# Patient Record
Sex: Female | Born: 1935 | Race: White | Hispanic: No | Smoking: Former smoker
Health system: Southern US, Community
[De-identification: ages and names within clinical notes are randomized; demographics above are authoritative.]

## PROBLEM LIST (undated history)

## (undated) DIAGNOSIS — E039 Hypothyroidism, unspecified: Secondary | ICD-10-CM

## (undated) DIAGNOSIS — I219 Acute myocardial infarction, unspecified: Secondary | ICD-10-CM

## (undated) DIAGNOSIS — I10 Essential (primary) hypertension: Secondary | ICD-10-CM

## (undated) DIAGNOSIS — M1990 Unspecified osteoarthritis, unspecified site: Secondary | ICD-10-CM

## (undated) DIAGNOSIS — I2510 Atherosclerotic heart disease of native coronary artery without angina pectoris: Secondary | ICD-10-CM

## (undated) DIAGNOSIS — D619 Aplastic anemia, unspecified: Secondary | ICD-10-CM

## (undated) DIAGNOSIS — E785 Hyperlipidemia, unspecified: Secondary | ICD-10-CM

## (undated) HISTORY — DX: Essential (primary) hypertension: I10

## (undated) HISTORY — DX: Unspecified osteoarthritis, unspecified site: M19.90

## (undated) HISTORY — DX: Hyperlipidemia, unspecified: E78.5

## (undated) HISTORY — DX: Atherosclerotic heart disease of native coronary artery without angina pectoris: I25.10

## (undated) HISTORY — DX: Acute myocardial infarction, unspecified: I21.9

## (undated) HISTORY — PX: CORONARY ANGIOPLASTY: SHX604

## (undated) HISTORY — DX: Aplastic anemia, unspecified: D61.9

## (undated) HISTORY — PX: HAND SURGERY: SHX662

## (undated) HISTORY — PX: APPENDECTOMY: SHX54

## (undated) HISTORY — PX: SHOULDER SURGERY: SHX246

## (undated) HISTORY — PX: THYROIDECTOMY, PARTIAL: SHX18

## (undated) HISTORY — PX: JOINT REPLACEMENT: SHX530

---

## 1983-03-09 HISTORY — PX: THORACIC SPINE SURGERY: SHX802

## 1998-03-08 HISTORY — PX: COLON SURGERY: SHX602

## 2000-03-08 HISTORY — PX: KNEE SURGERY: SHX244

## 2003-03-09 HISTORY — PX: CHOLECYSTECTOMY: SHX55

## 2008-03-08 HISTORY — PX: TOTAL KNEE ARTHROPLASTY: SHX125

## 2008-10-24 ENCOUNTER — Ambulatory Visit: Payer: Self-pay | Admitting: Cardiovascular Disease

## 2008-12-31 ENCOUNTER — Inpatient Hospital Stay (HOSPITAL_COMMUNITY): Admission: RE | Admit: 2008-12-31 | Discharge: 2009-01-04 | Payer: Self-pay | Admitting: Orthopedic Surgery

## 2008-12-31 ENCOUNTER — Ambulatory Visit: Payer: Self-pay | Admitting: Cardiology

## 2010-06-11 LAB — DIFFERENTIAL
Basophils Absolute: 0 10*3/uL (ref 0.0–0.1)
Basophils Relative: 0 % (ref 0–1)
Basophils Relative: 0 % (ref 0–1)
Eosinophils Absolute: 0.1 10*3/uL (ref 0.0–0.7)
Eosinophils Absolute: 0.1 10*3/uL (ref 0.0–0.7)
Eosinophils Absolute: 0.1 10*3/uL (ref 0.0–0.7)
Eosinophils Relative: 1 % (ref 0–5)
Eosinophils Relative: 1 % (ref 0–5)
Lymphocytes Relative: 12 % (ref 12–46)
Lymphocytes Relative: 9 % — ABNORMAL LOW (ref 12–46)
Lymphs Abs: 0.8 10*3/uL (ref 0.7–4.0)
Lymphs Abs: 0.9 10*3/uL (ref 0.7–4.0)
Monocytes Absolute: 0.7 10*3/uL (ref 0.1–1.0)
Monocytes Absolute: 0.7 10*3/uL (ref 0.1–1.0)
Monocytes Absolute: 0.7 10*3/uL (ref 0.1–1.0)
Monocytes Relative: 9 % (ref 3–12)
Neutro Abs: 5.9 10*3/uL (ref 1.7–7.7)
Neutro Abs: 7.7 10*3/uL (ref 1.7–7.7)
Neutrophils Relative %: 77 % (ref 43–77)

## 2010-06-11 LAB — CBC
HCT: 25.7 % — ABNORMAL LOW (ref 36.0–46.0)
HCT: 31.1 % — ABNORMAL LOW (ref 36.0–46.0)
Hemoglobin: 10.8 g/dL — ABNORMAL LOW (ref 12.0–15.0)
Hemoglobin: 12.2 g/dL (ref 12.0–15.0)
MCHC: 34.8 g/dL (ref 30.0–36.0)
MCV: 91.8 fL (ref 78.0–100.0)
MCV: 94.6 fL (ref 78.0–100.0)
Platelets: 176 10*3/uL (ref 150–400)
Platelets: 226 10*3/uL (ref 150–400)
Platelets: 307 10*3/uL (ref 150–400)
RBC: 3.09 MIL/uL — ABNORMAL LOW (ref 3.87–5.11)
RBC: 3.39 MIL/uL — ABNORMAL LOW (ref 3.87–5.11)
RBC: 3.79 MIL/uL — ABNORMAL LOW (ref 3.87–5.11)
RDW: 15.6 % — ABNORMAL HIGH (ref 11.5–15.5)
WBC: 6.4 10*3/uL (ref 4.0–10.5)
WBC: 7.6 10*3/uL (ref 4.0–10.5)
WBC: 8.1 10*3/uL (ref 4.0–10.5)

## 2010-06-11 LAB — BASIC METABOLIC PANEL
Chloride: 104 mEq/L (ref 96–112)
Chloride: 109 mEq/L (ref 96–112)
Creatinine, Ser: 0.85 mg/dL (ref 0.4–1.2)
GFR calc non Af Amer: >60 mL/min (ref >60)
GFR calc non Af Amer: >60 mL/min (ref >60)
Glucose, Bld: 113 mg/dL — ABNORMAL HIGH (ref 70–99)
Glucose, Bld: 121 mg/dL — ABNORMAL HIGH (ref 70–99)
Potassium: 3.8 mEq/L (ref 3.5–5.1)
Sodium: 133 mEq/L — ABNORMAL LOW (ref 135–145)
Sodium: 139 mEq/L (ref 135–145)

## 2010-06-11 LAB — TRANSFUSION REACTION
DAT C3: NEGATIVE
Post RXN DAT IgG: NEGATIVE

## 2010-06-11 LAB — URINALYSIS, MICROSCOPIC ONLY
Bilirubin Urine: NEGATIVE
Glucose, UA: NEGATIVE mg/dL
Ketones, ur: NEGATIVE mg/dL
Leukocytes, UA: NEGATIVE
Nitrite: NEGATIVE
Protein, ur: NEGATIVE mg/dL
Specific Gravity, Urine: 1.009 (ref 1.005–1.030)
Urobilinogen, UA: 0.2 mg/dL (ref 0.0–1.0)
pH: 5 (ref 5.0–8.0)

## 2010-06-11 LAB — COMPREHENSIVE METABOLIC PANEL
ALT: 26 U/L (ref 0–35)
Alkaline Phosphatase: 96 U/L (ref 39–117)
CO2: 26 mEq/L (ref 19–32)
Calcium: 8.9 mg/dL (ref 8.4–10.5)
Glucose, Bld: 95 mg/dL (ref 70–99)
Total Bilirubin: 0.4 mg/dL (ref 0.3–1.2)
Total Protein: 6.2 g/dL (ref 6.0–8.3)

## 2010-06-11 LAB — CROSSMATCH
ABO/RH(D): A POS
Antibody Screen: NEGATIVE

## 2010-06-11 LAB — APTT: aPTT: 28 seconds (ref 24–37)

## 2010-06-11 LAB — PROTIME-INR
INR: 1.03 (ref 0.00–1.49)
INR: 1.72 — ABNORMAL HIGH (ref 0.00–1.49)
INR: 1.99 — ABNORMAL HIGH (ref 0.00–1.49)
Prothrombin Time: 13.4 seconds (ref 11.6–15.2)
Prothrombin Time: 20.2 seconds — ABNORMAL HIGH (ref 11.6–15.2)
Prothrombin Time: 22.4 seconds — ABNORMAL HIGH (ref 11.6–15.2)

## 2010-06-11 LAB — TYPE AND SCREEN: Antibody Screen: NEGATIVE

## 2010-06-11 LAB — URINALYSIS, ROUTINE W REFLEX MICROSCOPIC
Bilirubin Urine: NEGATIVE
Ketones, ur: NEGATIVE mg/dL
Leukocytes, UA: NEGATIVE
Nitrite: NEGATIVE
Protein, ur: NEGATIVE mg/dL
Urobilinogen, UA: 0.2 mg/dL (ref 0.0–1.0)

## 2010-06-11 LAB — TROPONIN I
Troponin I: 0.01 ng/mL (ref 0.00–0.06)
Troponin I: 0.02 ng/mL (ref 0.00–0.06)

## 2010-06-11 LAB — URINE MICROSCOPIC-ADD ON

## 2010-06-11 LAB — CARDIAC PANEL(CRET KIN+CKTOT+MB+TROPI)
CK, MB: 10.6 ng/mL — ABNORMAL HIGH (ref 0.3–4.0)
Relative Index: 1.8 (ref 0.0–2.5)
Relative Index: 3.4 — ABNORMAL HIGH (ref 0.0–2.5)
Troponin I: 0.03 ng/mL (ref 0.00–0.06)

## 2010-06-24 ENCOUNTER — Encounter (HOSPITAL_COMMUNITY)
Admission: RE | Admit: 2010-06-24 | Discharge: 2010-06-24 | Disposition: A | Discharge status: Home / Self Care | Payer: Medicare Other | Source: Ambulatory Visit | Attending: Orthopedic Surgery | Admitting: Orthopedic Surgery

## 2010-06-24 ENCOUNTER — Other Ambulatory Visit (HOSPITAL_COMMUNITY): Payer: Self-pay | Admitting: Orthopedic Surgery

## 2010-06-24 ENCOUNTER — Ambulatory Visit (HOSPITAL_COMMUNITY)
Admission: RE | Admit: 2010-06-24 | Discharge: 2010-06-24 | Disposition: A | Discharge status: Home / Self Care | Payer: Medicare Other | Source: Ambulatory Visit | Attending: Orthopedic Surgery | Admitting: Orthopedic Surgery

## 2010-06-24 DIAGNOSIS — Z01811 Encounter for preprocedural respiratory examination: Secondary | ICD-10-CM | POA: Insufficient documentation

## 2010-06-24 DIAGNOSIS — Z01810 Encounter for preprocedural cardiovascular examination: Secondary | ICD-10-CM | POA: Insufficient documentation

## 2010-06-24 DIAGNOSIS — G5601 Carpal tunnel syndrome, right upper limb: Secondary | ICD-10-CM

## 2010-06-24 DIAGNOSIS — Z01812 Encounter for preprocedural laboratory examination: Secondary | ICD-10-CM | POA: Insufficient documentation

## 2010-06-24 DIAGNOSIS — G5600 Carpal tunnel syndrome, unspecified upper limb: Secondary | ICD-10-CM | POA: Insufficient documentation

## 2010-06-24 LAB — PROTIME-INR: Prothrombin Time: 12.6 seconds (ref 11.6–15.2)

## 2010-06-24 LAB — DIFFERENTIAL
Basophils Relative: 1 % (ref 0–1)
Eosinophils Absolute: 0.3 10*3/uL (ref 0.0–0.7)
Eosinophils Relative: 3 % (ref 0–5)
Lymphs Abs: 2.3 10*3/uL (ref 0.7–4.0)
Monocytes Absolute: 0.7 10*3/uL (ref 0.1–1.0)
Monocytes Relative: 7 % (ref 3–12)
Neutrophils Relative %: 67 % (ref 43–77)

## 2010-06-24 LAB — CBC
MCH: 28.8 pg (ref 26.0–34.0)
MCHC: 32.8 g/dL (ref 30.0–36.0)
MCV: 87.8 fL (ref 78.0–100.0)
Platelets: 393 10*3/uL (ref 150–400)
RBC: 4.03 MIL/uL (ref 3.87–5.11)
RDW: 13.6 % (ref 11.5–15.5)

## 2010-06-24 LAB — BASIC METABOLIC PANEL
CO2: 28 mEq/L (ref 19–32)
Calcium: 9.4 mg/dL (ref 8.4–10.5)
Chloride: 103 mEq/L (ref 96–112)
Creatinine, Ser: 0.86 mg/dL (ref 0.4–1.2)
GFR calc Af Amer: >60 mL/min (ref >60)
Glucose, Bld: 96 mg/dL (ref 70–99)

## 2010-06-24 LAB — URINALYSIS, ROUTINE W REFLEX MICROSCOPIC
Bilirubin Urine: NEGATIVE
Glucose, UA: NEGATIVE mg/dL
Protein, ur: NEGATIVE mg/dL
Urobilinogen, UA: 0.2 mg/dL (ref 0.0–1.0)

## 2010-06-24 LAB — SURGICAL PCR SCREEN: MRSA, PCR: NEGATIVE

## 2010-06-24 LAB — URINE MICROSCOPIC-ADD ON

## 2010-06-26 ENCOUNTER — Inpatient Hospital Stay (HOSPITAL_COMMUNITY)
Admission: RE | Admit: 2010-06-26 | Discharge: 2010-06-28 | DRG: 502 | Disposition: A | Discharge status: Home / Self Care | Payer: Medicare Other | Source: Ambulatory Visit | Attending: Orthopedic Surgery | Admitting: Orthopedic Surgery

## 2010-06-26 DIAGNOSIS — X58XXXA Exposure to other specified factors, initial encounter: Secondary | ICD-10-CM | POA: Diagnosis present

## 2010-06-26 DIAGNOSIS — Z7982 Long term (current) use of aspirin: Secondary | ICD-10-CM

## 2010-06-26 DIAGNOSIS — I2510 Atherosclerotic heart disease of native coronary artery without angina pectoris: Secondary | ICD-10-CM | POA: Diagnosis present

## 2010-06-26 DIAGNOSIS — M19019 Primary osteoarthritis, unspecified shoulder: Secondary | ICD-10-CM | POA: Diagnosis present

## 2010-06-26 DIAGNOSIS — Z01812 Encounter for preprocedural laboratory examination: Secondary | ICD-10-CM

## 2010-06-26 DIAGNOSIS — I252 Old myocardial infarction: Secondary | ICD-10-CM

## 2010-06-26 DIAGNOSIS — M25819 Other specified joint disorders, unspecified shoulder: Secondary | ICD-10-CM | POA: Diagnosis present

## 2010-06-26 DIAGNOSIS — Z7902 Long term (current) use of antithrombotics/antiplatelets: Secondary | ICD-10-CM

## 2010-06-26 DIAGNOSIS — Z01810 Encounter for preprocedural cardiovascular examination: Secondary | ICD-10-CM

## 2010-06-26 DIAGNOSIS — G5600 Carpal tunnel syndrome, unspecified upper limb: Secondary | ICD-10-CM | POA: Diagnosis present

## 2010-06-26 DIAGNOSIS — S43439A Superior glenoid labrum lesion of unspecified shoulder, initial encounter: Principal | ICD-10-CM | POA: Diagnosis present

## 2010-06-27 LAB — BASIC METABOLIC PANEL
Chloride: 111 mEq/L (ref 96–112)
GFR calc non Af Amer: >60 mL/min (ref >60)
Potassium: 3.8 mEq/L (ref 3.5–5.1)
Sodium: 140 mEq/L (ref 135–145)

## 2010-06-27 LAB — CBC
HCT: 28.3 % — ABNORMAL LOW (ref 36.0–46.0)
Platelets: 293 10*3/uL (ref 150–400)
RBC: 3.18 MIL/uL — ABNORMAL LOW (ref 3.87–5.11)
RDW: 14.1 % (ref 11.5–15.5)
WBC: 8.5 10*3/uL (ref 4.0–10.5)

## 2010-07-01 NOTE — Op Note (Signed)
Laura [name], Laura [name]               ACCOUNT NO.:  [id number]  MEDICAL RECORD NO.:  [id number]           PATIENT TYPE:  O  LOCATION:  4706                         FACILITY:  [facility]  PHYSICIAN:  Almedia Balls. [name], M.D. DATE OF BIRTH:  1933/12/16  DATE OF PROCEDURE:  06/26/2010 DATE OF DISCHARGE:                              OPERATIVE REPORT   PREOPERATIVE DIAGNOSES:  Right shoulder superior labrum anterior and posterior lesion impingement syndrome, and acromioclavicular joint arthritis as well as carpal tunnel syndrome.  POSTOPERATIVE DIAGNOSES:  Right shoulder superior labrum anterior and posterior lesion impingement syndrome, and acromioclavicular joint arthritis as well as carpal tunnel syndrome.  PROCEDURES PERFORMED: 1. Right shoulder arthroscopy with extensive intra-articular     debridement including debridement of SLAP lesion, arthroscopic     biceps tenotomy, arthroscopic subacromial decompression followed by     open biceps tenodesis on the groove and open distal clavicle     resection.  Performed under separate prep and drape. 2. Right carpal tunnel release.  ATTENDING SURGEON:  Almedia Balls. [name], MD  ASSISTANT:  Donnie Coffin. [name], PA-C  ANESTHESIA:  General anesthesia was used plus local anesthesia and infiltration.  ESTIMATED BLOOD LOSS:  Minimal.  FLUID REPLACEMENT:  1800 mL of crystalloid.  INSTRUMENT COUNT:  Correct.  COMPLICATIONS:  There were no complications.  Perioperative antibiotics were given.  INDICATIONS:  The patient is a 77 year old female with worsening right carpal tunnel syndrome as well as right shoulder pain secondary to SLAP lesion and symptomatic AC joint arthritis.  The patient has failed an extensive period of conservative management and desires operative treatment for progressive carpal tunnel syndrome as well as persistent right shoulder symptoms related to SLAP lesion as well as AC arthrosis. Informed consent was  obtained.  DESCRIPTION OF PROCEDURE:  After an adequate level of anesthesia achieved, the patient was positioned in the modified beach-chair position with the head holder placed and appropriate neurovascular structures padded.  The right arm was identified.  We placed a nonsterile tourniquet on the right proximal arm.  The right arm was sterilely prepped and draped in the usual manner.  We draped this out over a sterile Mayo table.  After exsanguination of the limb using an Esmarch bandage, we elevated the tourniquet to 275 mmHg.  Longitudinal incision was created in the palm in line with third and fourth ray. Dissection was down through the subcutaneous tissues using loupe magnification, identified the transverse carpal ligament and divided that safely in line with the skin incision.  I released the median nerve.  We released it into the mid palmar space and also into the superficial volar forearm fascia.  Once I complete, release of the nerve was performed.  We inspected the nerve in its entirety and was intact in good condition.  We thoroughly irrigated the wound and closed the wound with interrupted nylon suture followed by sterile compressive bandage, followed by short-arm splint.  We then directed our deflate her tourniquet to 17 minutes.  We directed our attention towards the shoulder work.  We went ahead and sterilely prepped and draped the shoulder arm in the usual manner.  We went ahead and placed our arthroscopic portals posterior, anterior and lateral portals were created in the usual manner with 11-blade scalpel and placement of the cannula in the joint using blunt obturators.  Identified pretty significant synovitis in the shoulder as well as torn superior labrum anterior-posterior within the unstable biceps anchor.  We performed a labral debridement of the biceps tenotomy.  We debrided back to stable labral rim using basket forceps and motorized shaver.  At this point,  we inspected the subscapularis, which was normal, the rotator cuff appeared normal as visualized from both anterior and posterior portals. Articular cartilage in the glenohumeral joint looked good with the exception of an area that appeared to have scuffed off cartilage, appeared to chronic, it was at the anterior aspect of the humeral head and was focal.  The remaining cartilage looked pretty much normal. Axillary pouch was inspected.  No loose bodies encountered.  Scope placed in the subacromial space.  We identified a normal rotator cuff. We performed a thorough bursectomy and acromioplasty creating a type 1 acromial shape with a butcher block technique using a high-speed bur. We then concluded the arthroscopic portion of the surgery and then directed our attention towards the [facility] joint which we did through a small saber incision overlying the AC joint.  Dissection down through subcutaneous tissues using Bovie.  Deltotrapezial fascia was identified and divided in line with distal clavicle followed by subperiosteal dissection distal clavicle followed by excision of distal 4 mm of bone using an oscillating saw.  We then applied bone wax to cut into the clavicle after thorough irrigation.  We irrigated the wound.  We then closed the wound to the deltotrapezial fascia using 0 Vicryl suture of figure-of-eight followed by 2-0 Vicryl subcutaneous closure and 4-0 Monocryl for skin.  We next addressed the biceps tenodesis which was performed through a small linear incision over the biceps tendon. Dissection down through the subcutaneous tissues, which split the deltoid bluntly along with its fibers identifying the soft tissue covering over the top of the biceps tunnel, we divided that using needle tip Bovie, delivered the tendon out of the wound, whip stitched that with #2 FiberWire suture to reinforce that portion of the tendon using the pair.  We then went ahead and placed a single Biomet  JuggerKnot suture anchor through the floor of the biceps groove bringing that suture up through the reinforced portion of tendon tying it down with the elbow at 90 degrees mid tension.  We oversewed the soft tissue over the top of the repair site with 0 Vicryl suture figure-of-eight. Following this, we had nice low profile repair not under significant tension through full arc of motion.  We thoroughly irrigated the deltoid interval there, then closed the deltoid to itself with 0 Vicryl suture followed by 2-0 Vicryl for subcutaneous closure and 4-0 Monocryl for skin.  Steri-Strips were applied followed by sterile dressing.  The patient tolerated the surgery well.     Almedia Balls. [name], M.D.     SRN/MEDQ  D:  06/26/2010  T:  06/27/2010  Job:  [id number]  Electronically Signed by Malon Kindle  on 07/01/2010 05:22:21 PM

## 2010-07-21 NOTE — Assessment & Plan Note (Signed)
[facility]                         CARDIOLOGY OFFICE NOTE   NAME:Laura [name], Laura [name]                        MRN:          [id number]  DATE:10/24/2008                            DOB:          1933-12-26    CHIEF COMPLAINT:  Preoperative cardiac evaluation.   HISTORY OF PRESENT ILLNESS:  Laura [name] is a 77 year old white female  with a past medical history significant for myocardial infarction and  coronary artery disease status post several percutaneous coronary  interventions, hypertension, hyperlipidemia, osteoarthritis presenting  for cardiac evaluation prior to right total knee replacement.  The  patient states that over the past year she has had increasing pain in  both knees, right greater than left.  This pain has severely decreased  her quality of life and she has a strong desire to have the knee  replacement surgery.  She states that she has had multiple percutaneous  coronary interventions with placements of stents, the last of which was  in March of 2006.  The patient states that in September of 2007, she had  come off her Plavix for approximately 1 week at which time in  anticipation of an elective procedure, she developed chest pain and was  diagnosed with a myocardial infarction, although the patient states that  no subsequent left heart catheterization was performed.  She was  restarted on the Plavix and the elective procedure was not undertaken.  She states that again in April of 2008, she stopped the Plavix for  several days.  She states at that time she again developed some chest  pain and shortness of breath and was taken to the emergency room and  that her heart may have stopped.  She states that no left heart  catheterization was performed at that time, but Plavix was reinstituted.  She states that since that time she has remained on the Plavix.  She  denies any episode of chest discomfort, except when walking up a steep  incline at her church; she predictably gets some shortness of breath and  chest pain at the end of the incline.  This has been the case for  several years and has not worsened in the near past.  She remains  compliant with all of her medications.  The patient states that she has  a strong desire to have this knee replacement surgery if at all possible  from a cardiac standpoint.   PAST MEDICAL HISTORY:  As above in HPI.   SOCIAL HISTORY:  History of tobacco and no alcohol.  The patient is a  retired Customer service manager.   FAMILY HISTORY:  Noncontributory.   ALLERGIES:  THE PATIENT STATES AN ALLERGY TO VICODIN AND TO PENICILLIN.   CURRENT MEDICATIONS:  Include:  1. Aspirin 81 mg daily.  2. Plavix 75 mg daily.  3. Toprol XL 50 mg daily.  4. Crestor 5 mg daily.  5. Synthroid 0.125 mg daily.  6. Amlodipine 5 mg daily.  7. Lidoderm patch p.r.n.  8. Senokot.  9. Fish oil.  10.Vitamin D.  11.Multivitamin.  12.Vitamin C.  13.Advil p.r.n.  14.The patient  also takes ramipril 2.5 mg daily.   REVIEW OF SYSTEMS:  Significant for swelling and pain bilateral knees,  right greater than left.  It was also positive for some mild ankle edema  that is chronic in nature and often worse at the end of the day.  She  denies any PND or orthopnea and she has not had any syncopal episodes.  Other systems are reviewed in HPI and otherwise negative.   PHYSICAL EXAMINATION:  Showed a blood pressure of 139/78, her pulse was  74.  She weighs 188 pounds.  Saturation 96% on room air.  GENERAL:  She is in acute distress.  HEENT:  Normocephalic and atraumatic.  Nonfocal.  NECK:  Supple.  There are no carotid bruits.  There was no JVD.  There  was no lymphadenopathy.  HEART:  Regular rate and rhythm without murmur, rub, or gallop.  LUNGS:  Clear to auscultation bilaterally.  ABDOMEN:  Soft, nontender, and nondistended.  EXTREMITIES:  Warm and dry.  There was trace ankle edema bilaterally.  Pulses: She is 2+  bilateral, carotid and femoral pulses.  NEURO EXAM:  Nonfocal.  PSYCHIATRIC:  The patient is appropriate with normal levels of insight.  SKIN:  Warm and dry without rash or lesion.   EKG from today independently interpreted by myself demonstrates normal  sinus rhythm without any ST or T wave abnormalities.   A review of the note from Ollen Gross, M.D., the patient's orthopedic  surgeon states that he would be willing to perform the surgery if  optimized from a cardiac standpoint and if the determination for  antiplatelet therapy in the time period surrounding the surgery was  clarified.   LABS:  Not available at this time.   The patient's specific cardiac records are not available at this time.   ASSESSMENT:  Laura [name] is a 77 year old white female with known  coronary artery disease status post multiple interventions who has a  somewhat complicated history with regard to stopping the Plavix.  The  patient states that she has had myocardial infarctions the two times she  has stopped the Plavix however, we do not have that current  documentation of this.  It is also not clear why subsequent ischemia  evaluations or a heart catheterization was not performed.  Both of these  episodes of events that the patient describes occurred more than 1 year  after stent placement.  If this was indeed the case, the patient would  clearly be at high risk for stopping the Plavix.  However, it does  appear that the osteoarthritis of the knee does severely limit the  patient's activity as she is very motivated to have the knee surgery  done.   PLAN:  We will first need to obtain the complete cardiac records in  order to determine and clarify the events that occurred when the patient  did stop taking her Plavix.  It is possible that the patient could be  admitted as an inpatient and that a bridge with Integrilin could be used  while the patient was off Plavix with reinstitution of the Plavix   immediately postop.  It should also be noted that the patient states she  stopped both aspirin and Plavix in the past which led to myocardial  infarction, therefore it may be that she may need to stay on the aspirin  throughout the time of the surgery if orthopedic surgery is in agreement  with this.  After a complete review of the  patient's cardiovascular  records, we will have a conversation with her and her orthopedic surgeon  regarding the most appropriate treatment plan.  For now, she should  continue on her medications as listed above.  Also, of note the patient  does not have any signs or symptoms consistent with any unstable process  currently.  Therefore, there is no current need for a repeat ischemia  evaluation.     Brayton El, MD  Electronically Signed    SGA/MedQ  DD: 10/24/2008  DT: 10/24/2008  Job #: [id number]

## 2010-07-21 NOTE — Letter (Signed)
December 25, 2008    Ollen Gross, M.D.   RE:  JESSACA, PHILIPPI  MRN:  [id number]  /  DOB:  [birth year]   Dear Dr. Lequita Halt,  I am writing to you to summarize our mutual patient, Torri Michalski,  cardiac history and plan for her upcoming total knee arthroplasty.  As  you know, she is a 77 year old white female with past medical history  significant for coronary artery disease.  She unfortunately has had  several issues with either MI's or stent thromboses after coming off  Plavix and aspirin in the past.  These have occurred over one year after  the stent was placed.  Her cardiovascular history started in 2005 when  she had a PCI to her LAD (3 x 16-mm Taxus) and subsequently had an acute  stent thrombosis treated with angioplasty.  In March 2006, she had  additional PCI to her LAD (2.5 x 24-mm stent) distal to the prior LAD  stent and stenting of her diagonal (2.0 x 18-mm Vision).  In September  2007 the patient came off her aspirin and Plavix for an outpatient  procedure and had a small NSTEMI.  Subsequent left heart catheterization  was unrevealing.  Again in 2008, the patient came off her aspirin and  Plavix for surgery and on postop day #2, the patient had a ventricular  tachycardia arrest and was found to have in-stent thrombosis in her  diagonal vessel and angioplasty was performed.  Since that time, the  patient has remained on her aspirin and Plavix without event.  She had a  negative myocardial perfusion scan in May 2009.   The patient is clearly at high risk for stent thrombosis if she is taken  off her aspirin and Plavix for any type of procedure.  It appears that  knee arthroplasty is very close to necessary in order for the patient to  maintain a satisfying quality of life.  Per our discussion and  discussions with Ms. Dapolito, the plan will be for her to stay on aspirin  and Plavix throughout the peri-operative period, including the day of  surgery and throughout the  postop course.  Ms. Eddie was made aware  that, if she continues the aspirin and Plavix her risk for stent  thrombosis will be significantly reduced, although not complelely  abolised secondary to the post-operative hypercoaguable state.  She  understands these risks.   It is my understanding that, on October 26, the surgery is scheduled to  be held at [facility].  I recommend that she be monitored  postoperatively in the intensive care unit and also that the serial  troponins and daily EKGs be performed.  I will inform the [facility]  Cardiology consult team of her admission to the hospital and also ask  you to call them to obtain an official consult.  This plan has been  discussed with Mrs. Alberson and she is aware of the potential risks of  thrombosis as well as the chance of increased bleeding postoperatively  secondary to remaining on these antiplatelet agents.  She is in  agreement with this approach.   Please contact my office at any time with any questions or concerns.    Sincerely,      Brayton El, MD  Electronically Signed    SGA/MedQ  DD: 12/25/2008  DT: 12/25/2008  Job #: [id number]

## 2010-07-25 NOTE — Discharge Summary (Signed)
  NAMESHANDON, MATSON               ACCOUNT NO.:  [id number]  MEDICAL RECORD NO.:  [id number]           PATIENT TYPE:  I  LOCATION:  4706                         FACILITY:  [facility]  PHYSICIAN:  Almedia Balls. [name], M.D. DATE OF BIRTH:  Sep 21, 1933  DATE OF ADMISSION:  06/26/2010 DATE OF DISCHARGE:  06/27/2010                              DISCHARGE SUMMARY   ADMISSION DIAGNOSIS:  Right shoulder pain secondary to a torn labrum and also carpal tunnel syndrome on the right.  DISCHARGE DIAGNOSIS:  Right shoulder pain secondary to a torn labrum and also carpal tunnel syndrome on the right, status post right shoulder arthroscopy and carpal tunnel release.  BRIEF HISTORY:  The patient is a 77 year old female with worsening right shoulder pain secondary to a SLAP lesion and also numbness and tingling down the median nerve distribution secondary to carpal tunnel syndrome. The patient elected for surgical management to alleviate the right shoulder pain and the right carpal tunnel symptoms.  PROCEDURE:  The patient had a right shoulder arthroscopy, biceps tenodesis, and open distal clavicle resection and also a right carpal tunnel release by Dr. Malon Kindle on June 26, 2010.  Assistant was [employer], PA-C.  General anesthesia was used.  No complications.  HOSPITAL COURSE:  The patient was admitted for surgical management of both areas in the right shoulder and right palm on June 26, 2010.  The patient tolerated the procedure well.  After adequate time in the Postanesthesia Care Unit, she was transferred up to 5000.  The patient underwent the above-stated procedure, which she tolerated well.  The patient on postop day #1 complained of mild pain in that right shoulder and right hand, but otherwise was doing fairly well, thus the patient was discharged home on postop day #1.  Neurologically, she was intact.  Capillary refill was 2 seconds.  All labs were within acceptable limits and the  patient was afebrile.  DISCHARGE PLANNING:  The patient will be discharged home on June 27, 2010.  CONDITION:  Stable.  DIET:  Regular.  FOLLOWUP:  The patient will follow back up with Dr. Malon Kindle in 2 weeks.  DISCHARGE MEDICATIONS: 1. Robaxin 500 mg p.o. q.6 h. 2. Percocet 5/325 one to two tablets q.4-6 h. p.r.n. pain.     Thomas B. [name], P.A.   ______________________________ Almedia Balls. [name], M.D.    TBD/MEDQ  D:  07/14/2010  T:  07/14/2010  Job:  [id number]  Electronically Signed by Standley Dakins P.A. on 07/22/2010 08:31:54 AM Electronically Signed by Malon Kindle  on 07/25/2010 07:03:47 PM

## 2010-11-03 ENCOUNTER — Encounter (HOSPITAL_COMMUNITY)
Admission: RE | Admit: 2010-11-03 | Discharge: 2010-11-03 | Disposition: A | Discharge status: Home / Self Care | Payer: Medicare Other | Source: Ambulatory Visit | Attending: Orthopedic Surgery | Admitting: Orthopedic Surgery

## 2010-11-03 LAB — CBC
HCT: 36.5 % (ref 36.0–46.0)
Hemoglobin: 12.4 g/dL (ref 12.0–15.0)
MCV: 86.7 fL (ref 78.0–100.0)
RBC: 4.21 MIL/uL (ref 3.87–5.11)
WBC: 8.3 10*3/uL (ref 4.0–10.5)

## 2010-11-03 LAB — SURGICAL PCR SCREEN
MRSA, PCR: NEGATIVE
Staphylococcus aureus: NEGATIVE

## 2010-11-03 LAB — APTT: aPTT: 35 seconds (ref 24–37)

## 2010-11-03 LAB — BASIC METABOLIC PANEL
Chloride: 106 mEq/L (ref 96–112)
Creatinine, Ser: 0.82 mg/dL (ref 0.50–1.10)
GFR calc Af Amer: >60 mL/min (ref >60)
Sodium: 142 mEq/L (ref 135–145)

## 2010-11-03 LAB — URINALYSIS, ROUTINE W REFLEX MICROSCOPIC
Ketones, ur: NEGATIVE mg/dL
Leukocytes, UA: NEGATIVE
Nitrite: NEGATIVE
Specific Gravity, Urine: 1.006 (ref 1.005–1.030)
pH: 6 (ref 5.0–8.0)

## 2010-11-03 LAB — DIFFERENTIAL
Lymphocytes Relative: 26 % (ref 12–46)
Lymphs Abs: 2.1 10*3/uL (ref 0.7–4.0)
Monocytes Relative: 6 % (ref 3–12)
Neutrophils Relative %: 63 % (ref 43–77)

## 2010-11-12 ENCOUNTER — Encounter: Payer: Self-pay | Admitting: Cardiovascular Disease

## 2010-11-13 ENCOUNTER — Ambulatory Visit (HOSPITAL_COMMUNITY)
Admission: RE | Admit: 2010-11-13 | Discharge: 2010-11-14 | Disposition: A | Discharge status: Home / Self Care | Payer: Medicare Other | Source: Ambulatory Visit | Attending: Orthopedic Surgery | Admitting: Orthopedic Surgery

## 2010-11-13 DIAGNOSIS — M75110 Incomplete rotator cuff tear or rupture of unspecified shoulder, not specified as traumatic: Secondary | ICD-10-CM | POA: Insufficient documentation

## 2010-11-13 DIAGNOSIS — Z01812 Encounter for preprocedural laboratory examination: Secondary | ICD-10-CM | POA: Insufficient documentation

## 2010-11-13 DIAGNOSIS — Z9861 Coronary angioplasty status: Secondary | ICD-10-CM | POA: Insufficient documentation

## 2010-11-13 DIAGNOSIS — I252 Old myocardial infarction: Secondary | ICD-10-CM | POA: Insufficient documentation

## 2010-11-13 DIAGNOSIS — X58XXXA Exposure to other specified factors, initial encounter: Secondary | ICD-10-CM | POA: Insufficient documentation

## 2010-11-13 DIAGNOSIS — K219 Gastro-esophageal reflux disease without esophagitis: Secondary | ICD-10-CM | POA: Insufficient documentation

## 2010-11-13 DIAGNOSIS — M9420 Chondromalacia, unspecified site: Secondary | ICD-10-CM | POA: Insufficient documentation

## 2010-11-13 DIAGNOSIS — S43439A Superior glenoid labrum lesion of unspecified shoulder, initial encounter: Secondary | ICD-10-CM | POA: Insufficient documentation

## 2010-11-13 DIAGNOSIS — M19019 Primary osteoarthritis, unspecified shoulder: Secondary | ICD-10-CM | POA: Insufficient documentation

## 2010-11-13 DIAGNOSIS — Z01810 Encounter for preprocedural cardiovascular examination: Secondary | ICD-10-CM | POA: Insufficient documentation

## 2010-11-14 LAB — CBC
Hemoglobin: 9.8 g/dL — ABNORMAL LOW (ref 12.0–15.0)
MCH: 29.1 pg (ref 26.0–34.0)
MCV: 87.2 fL (ref 78.0–100.0)
RBC: 3.37 MIL/uL — ABNORMAL LOW (ref 3.87–5.11)

## 2010-11-14 LAB — BASIC METABOLIC PANEL
BUN: 10 mg/dL (ref 6–23)
CO2: 24 mEq/L (ref 19–32)
Calcium: 8.2 mg/dL — ABNORMAL LOW (ref 8.4–10.5)
Creatinine, Ser: 0.71 mg/dL (ref 0.50–1.10)
Glucose, Bld: 92 mg/dL (ref 70–99)

## 2010-11-17 NOTE — Op Note (Signed)
Laura [name], [name]               ACCOUNT NO.:  [id number]  MEDICAL RECORD NO.:  [id number]  LOCATION:  5036                         FACILITY:  [facility]  PHYSICIAN:  Almedia Balls. [name], M.D. DATE OF BIRTH:  [birth year]  DATE OF PROCEDURE:  11/13/2010 DATE OF DISCHARGE:                              OPERATIVE REPORT   PREOPERATIVE DIAGNOSES:  Left shoulder partial-thickness rotator cuff tear, superior labral anterior-posterior lesion, acromioclavicular joint arthritis.  POSTOPERATIVE DIAGNOSES: 1. Left shoulder partial-thickness rotator cuff tear. 2. Left shoulder superior labral anterior-posterior lesion. 3. Left shoulder symptomatic acromioclavicular joint arthritis. 4. Glenohumeral chondromalacia grade 3.  PROCEDURES PERFORMED:  Left shoulder arthroscopy with debridement of partial-thickness rotator cuff tear with extensive debridement of superior labral anterior-posterior lesion with biceps tenotomy and chondroplasty, not to bleeding bone, humeral head and glenoid surface, followed by arthroscopic subacromial decompression with open biceps tenodesis in the groove and open distal clavicle resection.  ATTENDING SURGEON:  Yvette Rack. [name], MD  ASSISTANT:  Modesto Charon, PA-C  ANESTHESIA:  General anesthesia was used.  No interscalene block due to the patient is being on Plavix.  FLUID REPLACEMENT:  1000 mL crystalloid.  ESTIMATED BLOOD LOSS:  Less than 50 mL.  INSTRUMENT COUNTS:  Correct.  COMPLICATIONS:  None.  Perioperative antibiotics were given.  INDICATIONS:  The patient is a 77 year old female with worsening left shoulder pain secondary to MRI documented SLAP lesion, potential for partial-thickness rotator cuff tear, subacromial impingement with symptomatic AC arthritis.  The patient has failed all measures of conservative management and now desires operative treatment to restore function and eliminate pain to her shoulder.  Informed consent  obtained.  DESCRIPTION OF PROCEDURE:  After an adequate local anesthesia was achieved, the patient was positioned in modified beach-chair position. Left shoulder was correctly identified and sterilely prepped and draped in usual manner.  We went ahead and entered the shoulder using portals including anterior, posterior, and lateral portals.  We identified significant synovitis within the shoulder joint.  There was extensively torn superior labrum anterior-posterior.  Biceps tenotomy was performed as well as labral debridement with basket forceps and motorized shaver. Subscapularis was intact.  Rotator cuff had a partial-thickness undersurface supraspinatus tear, which was debrided back to stable tendinous tissue using a motorized shaver.  This represented less than 20% of the thickness of the tendon.  Infraspinatus and teres minor were normal.  The glenohumeral articular surfaces were dull with chondromalacia grade 3 with loose flaps and fibrillation.  I performed a chondroplasty just to smooth articular cartilage of the labrum, anterior- inferiorly and posterior-inferiorly was intact.  Following completion of a chondroplasty and debridement of the SLAP lesion and partial rotator cuff tear, we placed the scope in the subacromial space.  Thorough bursectomy and acromioplasty performed, creating a type 1 acromial shave.  With a thorough decompression of rotator cuff outlet, we inspected the rotator cuff in its entirety and noted no signs of any bursal side of tearing.  Following this, we concluded the arthroscopic portion of surgery.  We then made a Saber incision overlying the [facility] joint, dissection down through the subcutaneous tissues using Bovie, identified the deltotrapezial fascia and divided that in line with distal  clavicle.  Subperiosteal dissection in distal clavicle performed followed by excision of distal 3-4 mm using an oscillating saw.  We applied bone wax to the cut in the  clavicle.  Thorough irrigation of the AC interval followed by closure of the deltotrapezial fascia with 0 Vicryl interrupted suture, followed by 2-0 Vicryl subcutaneous closure and 4-0 Monocryl for skin.  We then made a small linear incision overlying the biceps proximally over the anterior deltoid.  Dissection down through the subcutaneous tissues using Bovie.  We split the deltoid bluntly along with its fibers, placed an Arthrex retractor identifying the bicipital groove.  We incised the biceps groove soft tissue covering and then I delivered the tendon out of the wound with whipstitch that with a #2 FiberWire suture, reinforced that portion of the tendon.  We then tenodesed the tendon at mid-tension with the elbow at 90 degrees using a Biomet JuggerKnot suture anchor through the floor of the biceps groove and bringing the suture up through the reinforced portion of tendon tying it down and flushed.  We oversewed the top of the bicipital sheath of the soft tissue with 0 Vicryl suture incorporating some of the tendon and that way, we had nice low profile solid repair.  We then inspected the rotator cuff.  I was able to palpate that and I did not notice any defects palpably in the rotator cuff or subscapularis tendon insertions on the greater tuberosity and lesser tuberosity respectively. Thoroughly irrigated the subdeltoid interval.  We then repaired the deltoid to itself with 0 Vicryl suture followed by 2-0 Vicryl subcutaneous closure and 4-0 Monocryl for skin.  Steri-Strips applied followed by a sterile dressing.  The patient tolerated the surgery well.     Almedia Balls. [name], M.D.     SRN/MEDQ  D:  11/13/2010  T:  11/13/2010  Job:  [id number]  Electronically Signed by Malon Kindle  on 11/17/2010 03:09:35 AM

## 2010-12-22 ENCOUNTER — Encounter: Payer: Self-pay | Admitting: Cardiovascular Disease

## 2012-01-03 ENCOUNTER — Other Ambulatory Visit: Payer: Self-pay | Admitting: Sports Medicine

## 2012-01-03 DIAGNOSIS — M545 Low back pain: Secondary | ICD-10-CM

## 2012-01-04 ENCOUNTER — Ambulatory Visit
Admission: RE | Admit: 2012-01-04 | Discharge: 2012-01-04 | Disposition: A | Discharge status: Home / Self Care | Payer: Medicare Other | Source: Ambulatory Visit | Attending: Sports Medicine | Admitting: Sports Medicine

## 2012-01-04 DIAGNOSIS — M545 Low back pain: Secondary | ICD-10-CM

## 2014-03-08 HISTORY — PX: EYE SURGERY: SHX253

## 2015-04-14 ENCOUNTER — Ambulatory Visit: Payer: Self-pay | Admitting: Orthopedic Surgery

## 2015-04-14 NOTE — Progress Notes (Signed)
Preoperative surgical orders have been place into the Epic hospital system for SHANELL ADEN on 04/14/2015, 11:15 AM  by Patrica Duel for surgery on 05-16-15.  Preop Total Knee orders including Experal, IV Tylenol, and IV Decadron as long as there are no contraindications to the above medications. Avel Peace, PA-C

## 2015-05-07 ENCOUNTER — Other Ambulatory Visit (HOSPITAL_COMMUNITY): Payer: Self-pay | Admitting: *Deleted

## 2015-05-07 NOTE — Progress Notes (Signed)
CARDIAC CLEARANCE DR FOLK ON CHART FOR 05-16-15 SURGERY STRESS TEST WITH EKG  02-17-15 [facility] [facility] ON CHART LOV DR Judithe Modest 04-25-15 CARDIOLOGY ON CHART CARDIAC CATH 06-06-12 [facility] ON CHART

## 2015-05-07 NOTE — Patient Instructions (Addendum)
Laura [name]  05/07/2015   Your procedure is scheduled on: 05-16-15  Report to [facility]  Entrance take [facility]  elevators to 3rd floor to  Short Stay Center at 515  AM.  Call this number if you have problems the morning of surgery [phone]   Remember: ONLY 1 PERSON MAY GO WITH YOU TO SHORT STAY TO GET  READY MORNING OF YOUR SURGERY.   Do not eat food or drink liquids :After Midnight.     Take these medicines the morning of surgery with A SIP OF WATER: AMLODIPINE (NORVASC), LEVOTHYROXINE (SYNTHROID), METOPROLOL, continue Plavix              You may not have any metal on your body including hair pins and              piercings  Do not wear jewelry, make-up, lotions, powders or perfumes, deodorant             Do not wear nail polish.  Do not shave  48 hours prior to surgery.              Men may shave face and neck.   Do not bring valuables to the hospital. [place] IS NOT             RESPONSIBLE   FOR VALUABLES.  Contacts, dentures or bridgework may not be worn into surgery.  Leave suitcase in the car. After surgery it may be brought to your room.     Patients discharged the day of surgery will not be allowed to drive home.  Name and phone number of your driver:  Special Instructions: N/A              Please read over the following fact sheets you were given: _____________________________________________________________________             [facility] - Preparing for Surgery Before surgery, you can play an important role.  Because skin is not sterile, your skin needs to be as free of germs as possible.  You can reduce the number of germs on your skin by washing with CHG (chlorahexidine gluconate) soap before surgery.  CHG is an antiseptic cleaner which kills germs and bonds with the skin to continue killing germs even after washing. Please DO NOT use if you have an allergy to CHG or antibacterial soaps.  If your skin becomes reddened/irritated  stop using the CHG and inform your nurse when you arrive at Short Stay. Do not shave (including legs and underarms) for at least 48 hours prior to the first CHG shower.  You may shave your face/neck. Please follow these instructions carefully:  1.  Shower with CHG Soap the night before surgery and the  morning of Surgery.  2.  If you choose to wash your hair, wash your hair first as usual with your  normal  shampoo.  3.  After you shampoo, rinse your hair and body thoroughly to remove the  shampoo.                           4.  Use CHG as you would any other liquid soap.  You can apply chg directly  to the skin and wash  Gently with a scrungie or clean washcloth.  5.  Apply the CHG Soap to your body ONLY FROM THE NECK DOWN.   Do not use on face/ open                           Wound or open sores. Avoid contact with eyes, ears mouth and genitals (private parts).                       Wash face,  Genitals (private parts) with your normal soap.             6.  Wash thoroughly, paying special attention to the area where your surgery  will be performed.  7.  Thoroughly rinse your body with warm water from the neck down.  8.  DO NOT shower/wash with your normal soap after using and rinsing off  the CHG Soap.                9.  Pat yourself dry with a clean towel.            10.  Wear clean pajamas.            11.  Place clean sheets on your bed the night of your first shower and do not  sleep with pets. Day of Surgery : Do not apply any lotions/deodorants the morning of surgery.  Please wear clean clothes to the hospital/surgery center.  FAILURE TO FOLLOW THESE INSTRUCTIONS MAY RESULT IN THE CANCELLATION OF YOUR SURGERY PATIENT SIGNATURE_________________________________  NURSE SIGNATURE__________________________________  ________________________________________________________________________   Adam Phenix  An incentive spirometer is a tool that can help keep your  lungs clear and active. This tool measures how well you are filling your lungs with each breath. Taking long deep breaths may help reverse or decrease the chance of developing breathing (pulmonary) problems (especially infection) following:  A long period of time when you are unable to move or be active. BEFORE THE PROCEDURE   If the spirometer includes an indicator to show your best effort, your nurse or respiratory therapist will set it to a desired goal.  If possible, sit up straight or lean slightly forward. Try not to slouch.  Hold the incentive spirometer in an upright position. INSTRUCTIONS FOR USE   Sit on the edge of your bed if possible, or sit up as far as you can in bed or on a chair.  Hold the incentive spirometer in an upright position.  Breathe out normally.  Place the mouthpiece in your mouth and seal your lips tightly around it.  Breathe in slowly and as deeply as possible, raising the piston or the ball toward the top of the column.  Hold your breath for 3-5 seconds or for as long as possible. Allow the piston or ball to fall to the bottom of the column.  Remove the mouthpiece from your mouth and breathe out normally.  Rest for a few seconds and repeat Steps 1 through 7 at least 10 times every 1-2 hours when you are awake. Take your time and take a few normal breaths between deep breaths.  The spirometer may include an indicator to show your best effort. Use the indicator as a goal to work toward during each repetition.  After each set of 10 deep breaths, practice coughing to be sure your lungs are clear. If you have an incision (the cut made at the time of surgery),  support your incision when coughing by placing a pillow or rolled up towels firmly against it. Once you are able to get out of bed, walk around indoors and cough well. You may stop using the incentive spirometer when instructed by your caregiver.  RISKS AND COMPLICATIONS  Take your time so you do not  get dizzy or light-headed.  If you are in pain, you may need to take or ask for pain medication before doing incentive spirometry. It is harder to take a deep breath if you are having pain. AFTER USE  Rest and breathe slowly and easily.  It can be helpful to keep track of a log of your progress. Your caregiver can provide you with a simple table to help with this. If you are using the spirometer at home, follow these instructions: [place] IF:   You are having difficultly using the spirometer.  You have trouble using the spirometer as often as instructed.  Your pain medication is not giving enough relief while using the spirometer.  You develop fever of 100.5 F (38.1 C) or higher. SEEK IMMEDIATE MEDICAL CARE IF:   You cough up bloody sputum that had not been present before.  You develop fever of 102 F (38.9 C) or greater.  You develop worsening pain at or near the incision site. MAKE SURE YOU:   Understand these instructions.  Will watch your condition.  Will get help right away if you are not doing well or get worse. Document Released: 07/05/2006 Document Revised: 05/17/2011 Document Reviewed: 09/05/2006 Exit[employer] Patient Information 2014 [employer], [place].   ________________________________________________________________________  WHAT IS A BLOOD TRANSFUSION? Blood Transfusion Information  A transfusion is the replacement of blood or some of its parts. Blood is made up of multiple cells which provide different functions.  Red blood cells carry oxygen and are used for blood loss replacement.  White blood cells fight against infection.  Platelets control bleeding.  Plasma helps clot blood.  Other blood products are available for specialized needs, such as hemophilia or other clotting disorders. BEFORE THE TRANSFUSION  Who gives blood for transfusions?   Healthy volunteers who are fully evaluated to make sure their blood is safe. This is blood bank  blood. Transfusion therapy is the safest it has ever been in the practice of medicine. Before blood is taken from a donor, a complete history is taken to make sure that person has no history of diseases nor engages in risky social behavior (examples are intravenous drug use or sexual activity with multiple partners). The donor's travel history is screened to minimize risk of transmitting infections, such as malaria. The donated blood is tested for signs of infectious diseases, such as HIV and hepatitis. The blood is then tested to be sure it is compatible with you in order to minimize the chance of a transfusion reaction. If you or a relative donates blood, this is often done in anticipation of surgery and is not appropriate for emergency situations. It takes many days to process the donated blood. RISKS AND COMPLICATIONS Although transfusion therapy is very safe and saves many lives, the main dangers of transfusion include:   Getting an infectious disease.  Developing a transfusion reaction. This is an allergic reaction to something in the blood you were given. Every precaution is taken to prevent this. The decision to have a blood transfusion has been considered carefully by your caregiver before blood is given. Blood is not given unless the benefits outweigh the risks. AFTER THE TRANSFUSION  Right after receiving a blood transfusion, you will usually feel much better and more energetic. This is especially true if your red blood cells have gotten low (anemic). The transfusion raises the level of the red blood cells which carry oxygen, and this usually causes an energy increase.  The nurse administering the transfusion will monitor you carefully for complications. HOME CARE INSTRUCTIONS  No special instructions are needed after a transfusion. You may find your energy is better. Speak with your caregiver about any limitations on activity for underlying diseases you may have. SEEK MEDICAL CARE IF:    Your condition is not improving after your transfusion.  You develop redness or irritation at the intravenous (IV) site. SEEK IMMEDIATE MEDICAL CARE IF:  Any of the following symptoms occur over the next 12 hours:  Shaking chills.  You have a temperature by mouth above 102 F (38.9 C), not controlled by medicine.  Chest, back, or muscle pain.  People around you feel you are not acting correctly or are confused.  Shortness of breath or difficulty breathing.  Dizziness and fainting.  You get a rash or develop hives.  You have a decrease in urine output.  Your urine turns a dark color or changes to pink, red, or brown. Any of the following symptoms occur over the next 10 days:  You have a temperature by mouth above 102 F (38.9 C), not controlled by medicine.  Shortness of breath.  Weakness after normal activity.  The white part of the eye turns yellow (jaundice).  You have a decrease in the amount of urine or are urinating less often.  Your urine turns a dark color or changes to pink, red, or brown. Document Released: 02/20/2000 Document Revised: 05/17/2011 Document Reviewed: 10/09/2007 [facility] Patient Information 2014 [place], [place].  _______________________________________________________________________

## 2015-05-08 ENCOUNTER — Encounter (HOSPITAL_COMMUNITY): Payer: Self-pay

## 2015-05-08 ENCOUNTER — Encounter (HOSPITAL_COMMUNITY)
Admission: RE | Admit: 2015-05-08 | Discharge: 2015-05-08 | Disposition: A | Discharge status: Home / Self Care | Payer: Medicare Other | Source: Ambulatory Visit | Attending: Orthopedic Surgery | Admitting: Orthopedic Surgery

## 2015-05-08 DIAGNOSIS — Z0183 Encounter for blood typing: Secondary | ICD-10-CM | POA: Diagnosis not present

## 2015-05-08 DIAGNOSIS — Z01812 Encounter for preprocedural laboratory examination: Secondary | ICD-10-CM | POA: Diagnosis present

## 2015-05-08 DIAGNOSIS — M1712 Unilateral primary osteoarthritis, left knee: Secondary | ICD-10-CM | POA: Insufficient documentation

## 2015-05-08 HISTORY — DX: Hypothyroidism, unspecified: E03.9

## 2015-05-08 LAB — COMPREHENSIVE METABOLIC PANEL
ALT: 17 U/L (ref 14–54)
AST: 23 U/L (ref 15–41)
Albumin: 4.1 g/dL (ref 3.5–5.0)
Alkaline Phosphatase: 98 U/L (ref 38–126)
Anion gap: 9 (ref 5–15)
BUN: 15 mg/dL (ref 6–20)
CHLORIDE: 107 mmol/L (ref 101–111)
CO2: 26 mmol/L (ref 22–32)
CREATININE: 1 mg/dL (ref 0.44–1.00)
Calcium: 9.3 mg/dL (ref 8.9–10.3)
GFR calc non Af Amer: 52 mL/min — ABNORMAL LOW (ref >60)
Glucose, Bld: 87 mg/dL (ref 65–99)
Potassium: 4.1 mmol/L (ref 3.5–5.1)
SODIUM: 142 mmol/L (ref 135–145)
Total Bilirubin: 0.5 mg/dL (ref 0.3–1.2)
Total Protein: 7.2 g/dL (ref 6.5–8.1)

## 2015-05-08 LAB — URINALYSIS, ROUTINE W REFLEX MICROSCOPIC
BILIRUBIN URINE: NEGATIVE
GLUCOSE, UA: NEGATIVE mg/dL
Ketones, ur: NEGATIVE mg/dL
Leukocytes, UA: NEGATIVE
Nitrite: NEGATIVE
PH: 5.5 (ref 5.0–8.0)
Protein, ur: NEGATIVE mg/dL
SPECIFIC GRAVITY, URINE: 1.012 (ref 1.005–1.030)

## 2015-05-08 LAB — CBC
HCT: 37.8 % (ref 36.0–46.0)
Hemoglobin: 11.9 g/dL — ABNORMAL LOW (ref 12.0–15.0)
MCH: 29.2 pg (ref 26.0–34.0)
MCHC: 31.5 g/dL (ref 30.0–36.0)
MCV: 92.9 fL (ref 78.0–100.0)
PLATELETS: 331 10*3/uL (ref 150–400)
RBC: 4.07 MIL/uL (ref 3.87–5.11)
RDW: 13.9 % (ref 11.5–15.5)
WBC: 6.2 10*3/uL (ref 4.0–10.5)

## 2015-05-08 LAB — PROTIME-INR
INR: 0.95 (ref 0.00–1.49)
Prothrombin Time: 12.9 seconds (ref 11.6–15.2)

## 2015-05-08 LAB — SURGICAL PCR SCREEN
MRSA, PCR: NEGATIVE
Staphylococcus aureus: NEGATIVE

## 2015-05-08 LAB — APTT: aPTT: 35 seconds (ref 24–37)

## 2015-05-08 LAB — ABO/RH: ABO/RH(D): A POS

## 2015-05-08 LAB — URINE MICROSCOPIC-ADD ON
BACTERIA UA: NONE SEEN
Squamous Epithelial / LPF: NONE SEEN
WBC, UA: NONE SEEN WBC/hpf (ref 0–5)

## 2015-05-08 NOTE — Progress Notes (Signed)
05/08/2015- New clearance note from Dr. Judithe Modest stateing for patient to continue Plavix for surgery and on chart.

## 2015-05-13 NOTE — H&P (Signed)
TOTAL KNEE ADMISSION H&P  Patient is being admitted for left total knee arthroplasty.  Subjective:  Chief Complaint:left knee pain.  HPI: Garwin BrothersSylvia S [name], 82 y.o. female, has a history of pain and functional disability in the left knee due to arthritis and has failed non-surgical conservative treatments for greater than 12 weeks to includeNSAID's and/or analgesics, corticosteriod injections, flexibility and strengthening excercises and activity modification.  Onset of symptoms was gradual, starting 7 years ago with gradually worsening course since that time. The patient noted no past surgery on the left knee(s).  Patient currently rates pain in the left knee(s) at 7 out of 10 with activity. Patient has night pain, worsening of pain with activity and weight bearing, pain that interferes with activities of daily living, pain with passive range of motion, crepitus and joint swelling.  Patient has evidence of periarticular osteophytes and joint space narrowing by imaging studies.  There is no active infection.   Past Medical History  Diagnosis Date  . CAD (coronary artery disease)     s/p PCI  . HTN (hypertension)   . HLD (hyperlipidemia)   . Osteoarthritis   . Hypothyroidism   . MI (myocardial infarction) (HCC) [phone] [id number],2006,2008    x3    Past Surgical History  Procedure Laterality Date  . Cholecystectomy  2005  . Colon surgery  2000  . Knee surgery  2002    left  . Thoracic spine surgery  1985  . Total knee arthroplasty  2010  . Thyroidectomy, partial    . Hand surgery      bilateral  . Shoulder surgery      bilateral  . Appendectomy    . Eye surgery  2016    bilateral cataract with lens implants  . Coronary angioplasty  2005,2006,2008    x3      Current outpatient prescriptions:  .  amLODipine (NORVASC) 5 MG tablet, Take 5 mg by mouth daily.  , Disp: , Rfl:  .  aspirin 81 MG tablet, Take 81 mg by mouth daily.  , Disp: , Rfl:  .  clopidogrel (PLAVIX) 75 MG tablet, Take 75 mg  by mouth daily.  , Disp: , Rfl:  .  DiphenhydrAMINE HCl, Sleep, (ZZZQUIL PO), Take 30 mLs by mouth at bedtime as needed (Sleep)., Disp: , Rfl:  .  Ibuprofen (ADVIL) 200 MG CAPS, Take by mouth as needed.  , Disp: , Rfl:  .  levothyroxine (SYNTHROID, LEVOTHROID) 125 MCG tablet, Take 125 mcg by mouth daily.  , Disp: , Rfl:  .  metoprolol (TOPROL-XL) 50 MG 24 hr tablet, Take 50 mg by mouth daily.  , Disp: , Rfl:  .  nitroGLYCERIN (NITROSTAT) 0.4 MG SL tablet, Place 0.4 mg under the tongue every 5 (five) minutes as needed.  , Disp: , Rfl:  .  ramipril (ALTACE) 2.5 MG capsule, Take 2.5 mg by mouth daily.  , Disp: , Rfl:  .  rosuvastatin (CRESTOR) 5 MG tablet, Take 5 mg by mouth daily.  , Disp: , Rfl:  .  senna (SENOKOT) 8.6 MG tablet, Take 2 tablets by mouth daily.  , Disp: , Rfl:   Allergies  Allergen Reactions  . Vicodin [Hydrocodone-Acetaminophen] Nausea And Vomiting  . Penicillins Swelling and Rash    Has patient had a PCN reaction causing immediate rash, facial/tongue/throat swelling, SOB or lightheadedness with hypotension: Yes Has patient had a PCN reaction causing severe rash involving mucus membranes or skin necrosis: No Has patient had a PCN reaction that  required hospitalization No Has patient had a PCN reaction occurring within the last 10 years: No If all of the above answers are "NO", then may proceed with Cephalosporin use.    Social History  Substance Use Topics  . Smoking status: Former Smoker    Types: Cigarettes    Quit date: 03/09/1983  . Smokeless tobacco: Not on file  . Alcohol Use: Yes     Comment: wine occassionally    Family History  Problem Relation Age of Onset  . Cancer    . Heart disease    . Drug abuse       Review of Systems  Constitutional: Negative.   HENT: Negative.   Eyes: Negative.   Respiratory: Negative.   Cardiovascular: Positive for leg swelling. Negative for chest pain, palpitations, orthopnea, claudication and PND.  Gastrointestinal:  Positive for constipation. Negative for heartburn, nausea, vomiting, abdominal pain, diarrhea, blood in stool and melena.  Genitourinary: Negative.   Musculoskeletal: Positive for myalgias, back pain and joint pain. Negative for falls and neck pain.  Skin: Negative.   Neurological: Negative.   Endo/Heme/Allergies: Negative.   Psychiatric/Behavioral: Negative for depression, suicidal ideas, hallucinations, memory loss and substance abuse. The patient has insomnia. The patient is not nervous/anxious.     Objective:  Physical Exam  Constitutional: She is oriented to person, place, and time. She appears well-developed. No distress.  Overweight  HENT:  Head: Normocephalic and atraumatic.  Right Ear: External ear normal.  Left Ear: External ear normal.  Nose: Nose normal.  Mouth/Throat: Oropharynx is clear and moist.  Eyes: Conjunctivae and EOM are normal.  Neck: Normal range of motion. Neck supple.  Cardiovascular: Normal rate, regular rhythm, normal heart sounds and intact distal pulses.   No murmur heard. Respiratory: Effort normal and breath sounds normal. No respiratory distress. She has no wheezes.  GI: She exhibits no distension. There is no tenderness.  Musculoskeletal:       Right hip: Normal.       Left hip: Normal.  Her right knee looks great. There is no swelling. Range is about 0 to 125. Left knee range is about 5 to 110, marked crepitus on range of motion, varus deformity tender medial greater than lateral with no instability.  Neurological: She is alert and oriented to person, place, and time. She has normal strength and normal reflexes. No sensory deficit.  Skin: No rash noted. She is not diaphoretic. No erythema.  Psychiatric: She has a normal mood and affect. Her behavior is normal.    Vitals  Weight: 175 lb Height: 66in Body Surface Area: 1.89 m Body Mass Index: 28.25 kg/m  Pulse: 76 (Regular)  BP: 118/68 (Sitting, Left Arm, Standard)  Imaging  Review Plain radiographs demonstrate severe degenerative joint disease of the left knee(s). The overall alignment ismild varus. The bone quality appears to be good for age and reported activity level.  Assessment/Plan:  End stage primary osteoarthritis, left knee   The patient history, physical examination, clinical judgment of the provider and imaging studies are consistent with end stage degenerative joint disease of the left knee(s) and total knee arthroplasty is deemed medically necessary. The treatment options including medical management, injection therapy arthroscopy and arthroplasty were discussed at length. The risks and benefits of total knee arthroplasty were presented and reviewed. The risks due to aseptic loosening, infection, stiffness, patella tracking problems, thromboembolic complications and other imponderables were discussed. The patient acknowledged the explanation, agreed to proceed with the plan and consent was signed.  Patient is being admitted for inpatient treatment for surgery, pain control, PT, OT, prophylactic antibiotics, VTE prophylaxis, progressive ambulation and ADL's and discharge planning. The patient is planning to be discharged home with home health services   MUST stay on Plavix MUST have general anesthesia Topical TXA   PCP: Daphane Shepherd, [facility] Cardio: Dr. Rondell Reams, PA-C

## 2015-05-15 NOTE — Anesthesia Preprocedure Evaluation (Addendum)
Anesthesia Evaluation  Patient identified by MRN, date of birth, ID band Patient awake    Reviewed: Allergy & Precautions, NPO status , Patient's Chart, lab work & pertinent test results  Airway Mallampati: II  TM Distance: >3 FB Neck ROM: Full    Dental no notable dental hx.    Pulmonary former smoker,    Pulmonary exam normal breath sounds clear to auscultation       Cardiovascular hypertension, Pt. on medications + CAD, + Past MI and + Cardiac Stents  Normal cardiovascular exam Rhythm:Regular Rate:Normal     Neuro/Psych negative neurological ROS  negative psych ROS   GI/Hepatic negative GI ROS, Neg liver ROS,   Endo/Other  Hypothyroidism   Renal/GU negative Renal ROS     Musculoskeletal  (+) Arthritis ,   Abdominal   Peds  Hematology negative hematology ROS (+)   Anesthesia Other Findings   Reproductive/Obstetrics negative OB ROS                           Anesthesia Physical Anesthesia Plan  ASA: III  Anesthesia Plan: General and Regional   Post-op Pain Management: MAC Combined w/ Regional for Post-op pain   Induction: Intravenous  Airway Management Planned: Oral ETT  Additional Equipment:   Intra-op Plan:   Post-operative Plan: Extubation in OR  Informed Consent: I have reviewed the patients History and Physical, chart, labs and discussed the procedure including the risks, benefits and alternatives for the proposed anesthesia with the patient or authorized representative who has indicated his/her understanding and acceptance.   Dental advisory given  Plan Discussed with: CRNA  Anesthesia Plan Comments:         Anesthesia Quick Evaluation

## 2015-05-16 ENCOUNTER — Encounter (HOSPITAL_COMMUNITY): Payer: Self-pay | Admitting: *Deleted

## 2015-05-16 ENCOUNTER — Inpatient Hospital Stay (HOSPITAL_COMMUNITY): Payer: Medicare Other | Admitting: Anesthesiology

## 2015-05-16 ENCOUNTER — Encounter (HOSPITAL_COMMUNITY)
Admission: RE | Disposition: A | Discharge status: Home Health | Payer: Self-pay | Source: Ambulatory Visit | Attending: Orthopedic Surgery

## 2015-05-16 ENCOUNTER — Inpatient Hospital Stay (HOSPITAL_COMMUNITY)
Admission: RE | Admit: 2015-05-16 | Discharge: 2015-05-18 | DRG: 470 | Disposition: A | Discharge status: Home Health | Payer: Medicare Other | Source: Ambulatory Visit | Attending: Orthopedic Surgery | Admitting: Orthopedic Surgery

## 2015-05-16 DIAGNOSIS — Z9049 Acquired absence of other specified parts of digestive tract: Secondary | ICD-10-CM | POA: Diagnosis not present

## 2015-05-16 DIAGNOSIS — E039 Hypothyroidism, unspecified: Secondary | ICD-10-CM | POA: Diagnosis present

## 2015-05-16 DIAGNOSIS — I2510 Atherosclerotic heart disease of native coronary artery without angina pectoris: Secondary | ICD-10-CM | POA: Diagnosis present

## 2015-05-16 DIAGNOSIS — I252 Old myocardial infarction: Secondary | ICD-10-CM | POA: Diagnosis not present

## 2015-05-16 DIAGNOSIS — E785 Hyperlipidemia, unspecified: Secondary | ICD-10-CM | POA: Diagnosis present

## 2015-05-16 DIAGNOSIS — Z87891 Personal history of nicotine dependence: Secondary | ICD-10-CM | POA: Diagnosis not present

## 2015-05-16 DIAGNOSIS — M179 Osteoarthritis of knee, unspecified: Secondary | ICD-10-CM | POA: Diagnosis present

## 2015-05-16 DIAGNOSIS — D62 Acute posthemorrhagic anemia: Secondary | ICD-10-CM | POA: Diagnosis not present

## 2015-05-16 DIAGNOSIS — Z01812 Encounter for preprocedural laboratory examination: Secondary | ICD-10-CM | POA: Diagnosis not present

## 2015-05-16 DIAGNOSIS — M1712 Unilateral primary osteoarthritis, left knee: Secondary | ICD-10-CM | POA: Diagnosis present

## 2015-05-16 DIAGNOSIS — Z9861 Coronary angioplasty status: Secondary | ICD-10-CM | POA: Diagnosis not present

## 2015-05-16 DIAGNOSIS — Z7982 Long term (current) use of aspirin: Secondary | ICD-10-CM

## 2015-05-16 DIAGNOSIS — M1710 Unilateral primary osteoarthritis, unspecified knee: Secondary | ICD-10-CM | POA: Diagnosis present

## 2015-05-16 DIAGNOSIS — I10 Essential (primary) hypertension: Secondary | ICD-10-CM | POA: Diagnosis present

## 2015-05-16 DIAGNOSIS — M25562 Pain in left knee: Secondary | ICD-10-CM | POA: Diagnosis present

## 2015-05-16 HISTORY — PX: TOTAL KNEE ARTHROPLASTY: SHX125

## 2015-05-16 LAB — TYPE AND SCREEN
ABO/RH(D): A POS
ANTIBODY SCREEN: NEGATIVE

## 2015-05-16 SURGERY — ARTHROPLASTY, KNEE, TOTAL
Anesthesia: Regional | Site: Knee | Laterality: Left

## 2015-05-16 MED ORDER — BUPIVACAINE HCL 0.25 % IJ SOLN
INTRAMUSCULAR | Status: DC | PRN
  Administered 2015-05-16: 20 mL

## 2015-05-16 MED ORDER — HYDROMORPHONE HCL 2 MG PO TABS: 2.0000 mg | ORAL_TABLET | ORAL | Status: DC | PRN

## 2015-05-16 MED ORDER — HYDROMORPHONE HCL 1 MG/ML IJ SOLN
INTRAMUSCULAR | Status: AC
  Filled 2015-05-16: qty 1

## 2015-05-16 MED ORDER — SODIUM CHLORIDE 0.9 % IV SOLN
2000.0000 mg | Freq: Once | INTRAVENOUS | Status: DC
  Filled 2015-05-16: qty 20

## 2015-05-16 MED ORDER — ACETAMINOPHEN 650 MG RE SUPP: 650.0000 mg | Freq: Four times a day (QID) | RECTAL | Status: DC | PRN

## 2015-05-16 MED ORDER — DEXAMETHASONE SODIUM PHOSPHATE 10 MG/ML IJ SOLN
INTRAMUSCULAR | Status: AC
Start: 2015-05-16 — End: 2015-05-16
  Filled 2015-05-16: qty 1

## 2015-05-16 MED ORDER — VANCOMYCIN HCL IN DEXTROSE 1-5 GM/200ML-% IV SOLN
INTRAVENOUS | Status: AC
  Filled 2015-05-16: qty 200

## 2015-05-16 MED ORDER — FENTANYL CITRATE (PF) 100 MCG/2ML IJ SOLN
INTRAMUSCULAR | Status: AC
  Filled 2015-05-16: qty 2

## 2015-05-16 MED ORDER — DEXAMETHASONE SODIUM PHOSPHATE 10 MG/ML IJ SOLN
10.0000 mg | Freq: Once | INTRAMUSCULAR | Status: AC
  Administered 2015-05-17: 10 mg via INTRAVENOUS
  Filled 2015-05-16: qty 1

## 2015-05-16 MED ORDER — ACETAMINOPHEN 500 MG PO TABS
1000.0000 mg | ORAL_TABLET | Freq: Four times a day (QID) | ORAL | Status: AC
  Administered 2015-05-16 – 2015-05-17 (×4): 1000 mg via ORAL
  Filled 2015-05-16 (×4): qty 2

## 2015-05-16 MED ORDER — SODIUM CHLORIDE 0.9 % IJ SOLN
INTRAMUSCULAR | Status: AC
  Filled 2015-05-16: qty 10

## 2015-05-16 MED ORDER — ROCURONIUM BROMIDE 100 MG/10ML IV SOLN
INTRAVENOUS | Status: AC
  Filled 2015-05-16: qty 1

## 2015-05-16 MED ORDER — METHOCARBAMOL 500 MG PO TABS: 500.0000 mg | ORAL_TABLET | Freq: Four times a day (QID) | ORAL | Status: DC | PRN

## 2015-05-16 MED ORDER — LEVOTHYROXINE SODIUM 125 MCG PO TABS
125.0000 ug | ORAL_TABLET | Freq: Every day | ORAL | Status: DC
  Administered 2015-05-17 – 2015-05-18 (×2): 125 ug via ORAL
  Filled 2015-05-16 (×2): qty 1

## 2015-05-16 MED ORDER — MENTHOL 3 MG MT LOZG: 1.0000 | LOZENGE | OROMUCOSAL | Status: DC | PRN

## 2015-05-16 MED ORDER — METOPROLOL SUCCINATE ER 50 MG PO TB24
50.0000 mg | ORAL_TABLET | Freq: Every day | ORAL | Status: DC
  Administered 2015-05-17 – 2015-05-18 (×2): 50 mg via ORAL
  Filled 2015-05-16 (×2): qty 1

## 2015-05-16 MED ORDER — SODIUM CHLORIDE 0.9 % IR SOLN
Status: DC | PRN
  Administered 2015-05-16: 1000 mL

## 2015-05-16 MED ORDER — ROSUVASTATIN CALCIUM 5 MG PO TABS
5.0000 mg | ORAL_TABLET | Freq: Every day | ORAL | Status: DC
  Administered 2015-05-16 – 2015-05-17 (×2): 5 mg via ORAL
  Filled 2015-05-16 (×3): qty 1

## 2015-05-16 MED ORDER — SODIUM CHLORIDE 0.9 % IJ SOLN
INTRAMUSCULAR | Status: DC | PRN
  Administered 2015-05-16: 30 mL

## 2015-05-16 MED ORDER — CEFAZOLIN SODIUM-DEXTROSE 2-3 GM-% IV SOLR: 2.0000 g | INTRAVENOUS | Status: DC

## 2015-05-16 MED ORDER — LIDOCAINE HCL (CARDIAC) 20 MG/ML IV SOLN
INTRAVENOUS | Status: DC | PRN
  Administered 2015-05-16: 60 mg via INTRAVENOUS

## 2015-05-16 MED ORDER — SENNOSIDES 8.6 MG PO TABS: 2.0000 | ORAL_TABLET | Freq: Every day | ORAL | Status: DC

## 2015-05-16 MED ORDER — DIPHENHYDRAMINE HCL 50 MG/ML IJ SOLN
12.5000 mg | INTRAMUSCULAR | Status: DC | PRN
  Administered 2015-05-16: 12.5 mg via INTRAVENOUS

## 2015-05-16 MED ORDER — STERILE WATER FOR IRRIGATION IR SOLN
Status: DC | PRN
  Administered 2015-05-16: 2000 mL

## 2015-05-16 MED ORDER — SUGAMMADEX SODIUM 200 MG/2ML IV SOLN
INTRAVENOUS | Status: AC
  Filled 2015-05-16: qty 2

## 2015-05-16 MED ORDER — FENTANYL CITRATE (PF) 100 MCG/2ML IJ SOLN
INTRAMUSCULAR | Status: DC | PRN
  Administered 2015-05-16 (×2): 50 ug via INTRAVENOUS

## 2015-05-16 MED ORDER — BUPIVACAINE-EPINEPHRINE (PF) 0.5% -1:200000 IJ SOLN
INTRAMUSCULAR | Status: AC
  Filled 2015-05-16: qty 30

## 2015-05-16 MED ORDER — PROPOFOL 10 MG/ML IV BOLUS
INTRAVENOUS | Status: AC
  Filled 2015-05-16: qty 20

## 2015-05-16 MED ORDER — ACETAMINOPHEN 325 MG PO TABS: 650.0000 mg | ORAL_TABLET | Freq: Four times a day (QID) | ORAL | Status: DC | PRN

## 2015-05-16 MED ORDER — DEXAMETHASONE SODIUM PHOSPHATE 10 MG/ML IJ SOLN
10.0000 mg | Freq: Once | INTRAMUSCULAR | Status: AC
  Administered 2015-05-16: 10 mg via INTRAVENOUS

## 2015-05-16 MED ORDER — KETOROLAC TROMETHAMINE 30 MG/ML IJ SOLN
INTRAMUSCULAR | Status: AC
Start: 2015-05-16 — End: 2015-05-16
  Filled 2015-05-16: qty 1

## 2015-05-16 MED ORDER — MEPERIDINE HCL 50 MG/ML IJ SOLN
6.2500 mg | INTRAMUSCULAR | Status: DC | PRN
Start: 2015-05-16 — End: 2015-05-16

## 2015-05-16 MED ORDER — VANCOMYCIN HCL IN DEXTROSE 1-5 GM/200ML-% IV SOLN
1000.0000 mg | Freq: Two times a day (BID) | INTRAVENOUS | Status: AC
  Administered 2015-05-16: 1000 mg via INTRAVENOUS
  Filled 2015-05-16: qty 200

## 2015-05-16 MED ORDER — EPHEDRINE SULFATE 50 MG/ML IJ SOLN
INTRAMUSCULAR | Status: AC
  Filled 2015-05-16: qty 1

## 2015-05-16 MED ORDER — ONDANSETRON HCL 4 MG/2ML IJ SOLN: 4.0000 mg | Freq: Four times a day (QID) | INTRAMUSCULAR | Status: DC | PRN

## 2015-05-16 MED ORDER — METOCLOPRAMIDE HCL 10 MG PO TABS: 5.0000 mg | ORAL_TABLET | Freq: Three times a day (TID) | ORAL | Status: DC | PRN

## 2015-05-16 MED ORDER — DIPHENHYDRAMINE HCL 50 MG/ML IJ SOLN
INTRAMUSCULAR | Status: AC
  Filled 2015-05-16: qty 1

## 2015-05-16 MED ORDER — BISACODYL 10 MG RE SUPP: 10.0000 mg | Freq: Every day | RECTAL | Status: DC | PRN

## 2015-05-16 MED ORDER — LIDOCAINE HCL (CARDIAC) 20 MG/ML IV SOLN
INTRAVENOUS | Status: AC
  Filled 2015-05-16: qty 5

## 2015-05-16 MED ORDER — HYDROMORPHONE HCL 2 MG PO TABS
2.0000 mg | ORAL_TABLET | ORAL | Status: DC | PRN
  Administered 2015-05-16 – 2015-05-17 (×3): 4 mg via ORAL
  Administered 2015-05-17: 2 mg via ORAL
  Administered 2015-05-18: 4 mg via ORAL
  Filled 2015-05-16: qty 1
  Filled 2015-05-16 (×4): qty 2

## 2015-05-16 MED ORDER — PHENOL 1.4 % MT LIQD: 1.0000 | OROMUCOSAL | Status: DC | PRN

## 2015-05-16 MED ORDER — METHOCARBAMOL 1000 MG/10ML IJ SOLN
500.0000 mg | Freq: Four times a day (QID) | INTRAVENOUS | Status: DC | PRN
  Administered 2015-05-16: 500 mg via INTRAVENOUS
  Filled 2015-05-16 (×2): qty 5

## 2015-05-16 MED ORDER — NITROGLYCERIN 0.4 MG SL SUBL: 0.4000 mg | SUBLINGUAL_TABLET | SUBLINGUAL | Status: DC | PRN

## 2015-05-16 MED ORDER — TRAMADOL HCL 50 MG PO TABS: 50.0000 mg | ORAL_TABLET | Freq: Four times a day (QID) | ORAL | Status: DC | PRN

## 2015-05-16 MED ORDER — CHLORHEXIDINE GLUCONATE 4 % EX LIQD: 60.0000 mL | Freq: Once | CUTANEOUS | Status: DC

## 2015-05-16 MED ORDER — TRAMADOL HCL 50 MG PO TABS
50.0000 mg | ORAL_TABLET | Freq: Four times a day (QID) | ORAL | Status: DC | PRN
  Administered 2015-05-17: 100 mg via ORAL
  Filled 2015-05-16: qty 2

## 2015-05-16 MED ORDER — BUPIVACAINE HCL (PF) 0.25 % IJ SOLN
INTRAMUSCULAR | Status: AC
  Filled 2015-05-16: qty 30

## 2015-05-16 MED ORDER — ONDANSETRON HCL 4 MG/2ML IJ SOLN
INTRAMUSCULAR | Status: DC | PRN
  Administered 2015-05-16 (×4): 2 mg via INTRAVENOUS

## 2015-05-16 MED ORDER — BUPIVACAINE-EPINEPHRINE (PF) 0.5% -1:200000 IJ SOLN
INTRAMUSCULAR | Status: DC | PRN
  Administered 2015-05-16: 30 mL via PERINEURAL

## 2015-05-16 MED ORDER — 0.9 % SODIUM CHLORIDE (POUR BTL) OPTIME
TOPICAL | Status: DC | PRN
  Administered 2015-05-16: 1000 mL

## 2015-05-16 MED ORDER — PROMETHAZINE HCL 25 MG/ML IJ SOLN: 6.2500 mg | INTRAMUSCULAR | Status: DC | PRN

## 2015-05-16 MED ORDER — BUPIVACAINE-EPINEPHRINE (PF) 0.25% -1:200000 IJ SOLN
INTRAMUSCULAR | Status: AC
  Filled 2015-05-16: qty 30

## 2015-05-16 MED ORDER — ROCURONIUM BROMIDE 100 MG/10ML IV SOLN
INTRAVENOUS | Status: DC | PRN
  Administered 2015-05-16: 40 mg via INTRAVENOUS

## 2015-05-16 MED ORDER — ASPIRIN EC 81 MG PO TBEC
81.0000 mg | DELAYED_RELEASE_TABLET | Freq: Every day | ORAL | Status: DC
  Administered 2015-05-17 – 2015-05-18 (×2): 81 mg via ORAL
  Filled 2015-05-16 (×2): qty 1

## 2015-05-16 MED ORDER — ONDANSETRON HCL 4 MG PO TABS: 4.0000 mg | ORAL_TABLET | Freq: Four times a day (QID) | ORAL | Status: DC | PRN

## 2015-05-16 MED ORDER — METOCLOPRAMIDE HCL 5 MG/ML IJ SOLN: 5.0000 mg | Freq: Three times a day (TID) | INTRAMUSCULAR | Status: DC | PRN

## 2015-05-16 MED ORDER — ACETAMINOPHEN 10 MG/ML IV SOLN
INTRAVENOUS | Status: AC
  Filled 2015-05-16: qty 100

## 2015-05-16 MED ORDER — DIPHENHYDRAMINE HCL 12.5 MG/5ML PO ELIX
12.5000 mg | ORAL_SOLUTION | ORAL | Status: DC | PRN
  Administered 2015-05-16 (×2): 25 mg via ORAL
  Filled 2015-05-16 (×2): qty 10

## 2015-05-16 MED ORDER — ASPIRIN 81 MG PO TABS: 81.0000 mg | ORAL_TABLET | Freq: Every day | ORAL | Status: DC

## 2015-05-16 MED ORDER — LACTATED RINGERS IV SOLN
INTRAVENOUS | Status: DC | PRN
  Administered 2015-05-16 (×2): via INTRAVENOUS

## 2015-05-16 MED ORDER — DOCUSATE SODIUM 100 MG PO CAPS
100.0000 mg | ORAL_CAPSULE | Freq: Two times a day (BID) | ORAL | Status: DC
  Administered 2015-05-16 – 2015-05-18 (×4): 100 mg via ORAL
  Filled 2015-05-16 (×4): qty 1

## 2015-05-16 MED ORDER — CLOPIDOGREL BISULFATE 75 MG PO TABS
75.0000 mg | ORAL_TABLET | Freq: Every day | ORAL | Status: DC
  Administered 2015-05-17 – 2015-05-18 (×2): 75 mg via ORAL
  Filled 2015-05-16 (×2): qty 1

## 2015-05-16 MED ORDER — HYDROMORPHONE HCL 1 MG/ML IJ SOLN
0.2500 mg | INTRAMUSCULAR | Status: DC | PRN
  Administered 2015-05-16 (×4): 0.5 mg via INTRAVENOUS

## 2015-05-16 MED ORDER — SUGAMMADEX SODIUM 200 MG/2ML IV SOLN
INTRAVENOUS | Status: DC | PRN
  Administered 2015-05-16: 175 mg via INTRAVENOUS

## 2015-05-16 MED ORDER — RAMIPRIL 2.5 MG PO CAPS
2.5000 mg | ORAL_CAPSULE | Freq: Every day | ORAL | Status: DC
  Filled 2015-05-16: qty 1

## 2015-05-16 MED ORDER — BUPIVACAINE LIPOSOME 1.3 % IJ SUSP
INTRAMUSCULAR | Status: DC | PRN
  Administered 2015-05-16: 20 mL

## 2015-05-16 MED ORDER — MIDAZOLAM HCL 5 MG/5ML IJ SOLN
INTRAMUSCULAR | Status: DC | PRN
  Administered 2015-05-16 (×2): .5 mg via INTRAVENOUS
  Administered 2015-05-16: 1 mg via INTRAVENOUS

## 2015-05-16 MED ORDER — MIDAZOLAM HCL 2 MG/2ML IJ SOLN
INTRAMUSCULAR | Status: AC
  Filled 2015-05-16: qty 2

## 2015-05-16 MED ORDER — POLYETHYLENE GLYCOL 3350 17 G PO PACK: 17.0000 g | PACK | Freq: Every day | ORAL | Status: DC | PRN

## 2015-05-16 MED ORDER — EPHEDRINE SULFATE 50 MG/ML IJ SOLN
INTRAMUSCULAR | Status: DC | PRN
  Administered 2015-05-16: 5 mg via INTRAVENOUS

## 2015-05-16 MED ORDER — SODIUM CHLORIDE 0.9 % IJ SOLN
INTRAMUSCULAR | Status: AC
  Filled 2015-05-16: qty 50

## 2015-05-16 MED ORDER — TRANEXAMIC ACID 1000 MG/10ML IV SOLN
2000.0000 mg | INTRAVENOUS | Status: DC | PRN
  Administered 2015-05-16: 2000 mg via TOPICAL

## 2015-05-16 MED ORDER — SODIUM CHLORIDE 0.9 % IV SOLN: INTRAVENOUS | Status: DC

## 2015-05-16 MED ORDER — SODIUM CHLORIDE 0.9 % IV SOLN
INTRAVENOUS | Status: DC
  Administered 2015-05-16 – 2015-05-18 (×3): via INTRAVENOUS

## 2015-05-16 MED ORDER — AMLODIPINE BESYLATE 5 MG PO TABS
5.0000 mg | ORAL_TABLET | Freq: Every day | ORAL | Status: DC
  Filled 2015-05-16 (×2): qty 1

## 2015-05-16 MED ORDER — PROPOFOL 10 MG/ML IV BOLUS
INTRAVENOUS | Status: DC | PRN
  Administered 2015-05-16: 150 mg via INTRAVENOUS

## 2015-05-16 MED ORDER — BUPIVACAINE LIPOSOME 1.3 % IJ SUSP
20.0000 mL | Freq: Once | INTRAMUSCULAR | Status: DC
  Filled 2015-05-16: qty 20

## 2015-05-16 MED ORDER — KETOROLAC TROMETHAMINE 30 MG/ML IJ SOLN
30.0000 mg | Freq: Once | INTRAMUSCULAR | Status: AC | PRN
  Administered 2015-05-16: 30 mg via INTRAVENOUS

## 2015-05-16 MED ORDER — ACETAMINOPHEN 10 MG/ML IV SOLN
1000.0000 mg | Freq: Once | INTRAVENOUS | Status: AC
  Administered 2015-05-16: 1000 mg via INTRAVENOUS

## 2015-05-16 MED ORDER — VANCOMYCIN HCL IN DEXTROSE 1-5 GM/200ML-% IV SOLN
1000.0000 mg | Freq: Once | INTRAVENOUS | Status: AC
  Administered 2015-05-16: 1000 mg via INTRAVENOUS

## 2015-05-16 MED ORDER — FLEET ENEMA 7-19 GM/118ML RE ENEM: 1.0000 | ENEMA | Freq: Once | RECTAL | Status: DC | PRN

## 2015-05-16 MED ORDER — SENNA 8.6 MG PO TABS
2.0000 | ORAL_TABLET | Freq: Every day | ORAL | Status: DC
  Administered 2015-05-16 – 2015-05-17 (×2): 17.2 mg via ORAL
  Filled 2015-05-16 (×2): qty 2

## 2015-05-16 MED ORDER — MORPHINE SULFATE (PF) 2 MG/ML IV SOLN
1.0000 mg | INTRAVENOUS | Status: DC | PRN
  Administered 2015-05-17: 1 mg via INTRAVENOUS
  Filled 2015-05-16: qty 1

## 2015-05-16 MED ORDER — ONDANSETRON HCL 4 MG/2ML IJ SOLN
INTRAMUSCULAR | Status: AC
  Filled 2015-05-16: qty 4

## 2015-05-16 SURGICAL SUPPLY — 49 items
BAG DECANTER FOR FLEXI CONT (MISCELLANEOUS) ×3 IMPLANT
BAG ZIPLOCK 12X15 (MISCELLANEOUS) ×3 IMPLANT
BANDAGE ACE 6X5 VEL STRL LF (GAUZE/BANDAGES/DRESSINGS) ×3 IMPLANT
BLADE SAG 18X100X1.27 (BLADE) ×3 IMPLANT
BLADE SAW SGTL 11.0X1.19X90.0M (BLADE) ×3 IMPLANT
BOWL SMART MIX CTS (DISPOSABLE) ×3 IMPLANT
CAP KNEE TOTAL 3 SIGMA ×3 IMPLANT
CEMENT HV SMART SET (Cement) ×6 IMPLANT
CLOSURE WOUND 1/2 X4 (GAUZE/BANDAGES/DRESSINGS) ×1
CLOTH BEACON ORANGE TIMEOUT ST (SAFETY) ×3 IMPLANT
CUFF TOURN SGL QUICK 34 (TOURNIQUET CUFF) ×2
CUFF TRNQT CYL 34X4X40X1 (TOURNIQUET CUFF) ×1 IMPLANT
DECANTER SPIKE VIAL GLASS SM (MISCELLANEOUS) ×3 IMPLANT
DRAPE U-SHAPE 47X51 STRL (DRAPES) ×3 IMPLANT
DRSG ADAPTIC 3X8 NADH LF (GAUZE/BANDAGES/DRESSINGS) ×3 IMPLANT
DRSG PAD ABDOMINAL 8X10 ST (GAUZE/BANDAGES/DRESSINGS) ×3 IMPLANT
DURAPREP 26ML APPLICATOR (WOUND CARE) ×3 IMPLANT
ELECT REM PT RETURN 9FT ADLT (ELECTROSURGICAL) ×3
ELECTRODE REM PT RTRN 9FT ADLT (ELECTROSURGICAL) ×1 IMPLANT
EVACUATOR 1/8 PVC DRAIN (DRAIN) ×3 IMPLANT
GAUZE SPONGE 4X4 12PLY STRL (GAUZE/BANDAGES/DRESSINGS) ×3 IMPLANT
GLOVE BIO SURGEON STRL SZ 6 (GLOVE) ×3 IMPLANT
GLOVE BIO SURGEON STRL SZ7.5 (GLOVE) ×3 IMPLANT
GLOVE BIO SURGEON STRL SZ8 (GLOVE) ×3 IMPLANT
GLOVE BIOGEL PI IND STRL 6.5 (GLOVE) ×2 IMPLANT
GLOVE BIOGEL PI IND STRL 8 (GLOVE) ×2 IMPLANT
GLOVE BIOGEL PI INDICATOR 6.5 (GLOVE) ×4
GLOVE BIOGEL PI INDICATOR 8 (GLOVE) ×4
GLOVE SURG SS PI 6.5 STRL IVOR (GLOVE) ×3 IMPLANT
GOWN STRL REUS W/TWL LRG LVL3 (GOWN DISPOSABLE) ×3 IMPLANT
GOWN STRL REUS W/TWL XL LVL3 (GOWN DISPOSABLE) ×9 IMPLANT
HANDPIECE INTERPULSE COAX TIP (DISPOSABLE) ×2
IMMOBILIZER KNEE 20 (SOFTGOODS) ×3
IMMOBILIZER KNEE 20 THIGH 36 (SOFTGOODS) ×1 IMPLANT
MANIFOLD NEPTUNE II (INSTRUMENTS) ×3 IMPLANT
PACK TOTAL KNEE CUSTOM (KITS) ×3 IMPLANT
PAD ABD 8X10 STRL (GAUZE/BANDAGES/DRESSINGS) ×3 IMPLANT
PADDING CAST COTTON 6X4 STRL (CAST SUPPLIES) ×3 IMPLANT
POSITIONER SURGICAL ARM (MISCELLANEOUS) ×3 IMPLANT
SET HNDPC FAN SPRY TIP SCT (DISPOSABLE) ×1 IMPLANT
STRIP CLOSURE SKIN 1/2X4 (GAUZE/BANDAGES/DRESSINGS) ×2 IMPLANT
SUT MNCRL AB 4-0 PS2 18 (SUTURE) ×3 IMPLANT
SUT VIC AB 2-0 CT1 27 (SUTURE) ×6
SUT VIC AB 2-0 CT1 TAPERPNT 27 (SUTURE) ×3 IMPLANT
SUT VLOC 180 0 24IN GS25 (SUTURE) ×3 IMPLANT
SYR 50ML LL SCALE MARK (SYRINGE) ×3 IMPLANT
TRAY FOLEY W/METER SILVER 14FR (SET/KITS/TRAYS/PACK) ×3 IMPLANT
WRAP KNEE MAXI GEL POST OP (GAUZE/BANDAGES/DRESSINGS) ×3 IMPLANT
YANKAUER SUCT BULB TIP 10FT TU (MISCELLANEOUS) ×3 IMPLANT

## 2015-05-16 NOTE — Addendum Note (Signed)
Addendum  created 05/16/15 1357 by Elyn PeersSandra J Brittain Smithey, CRNA   Modules edited: Charges VN

## 2015-05-16 NOTE — Op Note (Signed)
Pre-operative diagnosis- Osteoarthritis  Left knee(s)  Post-operative diagnosis- Osteoarthritis Left knee(s)  Procedure-  Left  Total Knee Arthroplasty  Surgeon- Laura [name]. Laura Traister, MD  Assistant- Laura Peace, PA-C   Anesthesia-  Adductor canal block and General  EBL-* No blood loss amount entered *   Drains Hemovac  Tourniquet time-  Total Tourniquet Time Documented: Thigh (Left) - 36 minutes Total: Thigh (Left) - 36 minutes     Complications- None  Condition-PACU - hemodynamically stable.   Brief Clinical Note  Laura [name] is a 82 y.o. year old female with end stage OA of her left knee with progressively worsening pain and dysfunction. She has constant pain, with activity and at rest and significant functional deficits with difficulties even with ADLs. She has had extensive non-op management including analgesics, injections of cortisone and viscosupplements, and home exercise program, but remains in significant pain with significant dysfunction. Radiographs show bone on bone arthritis medial and patellofemoral. She presents now for left Total Knee Arthroplasty.     Procedure in detail---   The patient is brought into the operating room and positioned supine on the operating table. After successful administration of  adductor canal block and General,   a tourniquet is placed high on the  Left thigh(s) and the lower extremity is prepped and draped in the usual sterile fashion. Time out is performed by the operating team and then the  Left lower extremity is wrapped in Esmarch, knee flexed and the tourniquet inflated to 300 mmHg.       A midline incision is made with a ten blade through the subcutaneous tissue to the level of the extensor mechanism. A fresh blade is used to make a medial parapatellar arthrotomy. Soft tissue over the proximal medial tibia is subperiosteally elevated to the joint line with a knife and into the semimembranosus bursa with a Cobb elevator. Soft tissue  over the proximal lateral tibia is elevated with attention being paid to avoiding the patellar tendon on the tibial tubercle. The patella is everted, knee flexed 90 degrees and the ACL and PCL are removed. Findings are bone on bone medial and patellofemoral with large global osteophytes.        The drill is used to create a starting hole in the distal femur and the canal is thoroughly irrigated with sterile saline to remove the fatty contents. The 5 degree Left  valgus alignment guide is placed into the femoral canal and the distal femoral cutting block is pinned to remove 10 mm off the distal femur. Resection is made with an oscillating saw.      The tibia is subluxed forward and the menisci are removed. The extramedullary alignment guide is placed referencing proximally at the medial aspect of the tibial tubercle and distally along the second metatarsal axis and tibial crest. The block is pinned to remove 2mm off the more deficient medial  side. Resection is made with an oscillating saw. Size 2.5is the most appropriate size for the tibia and the proximal tibia is prepared with the modular drill and keel punch for that size.      The femoral sizing guide is placed and size 3 is most appropriate. Rotation is marked off the epicondylar axis and confirmed by creating a rectangular flexion gap at 90 degrees. The size 3 cutting block is pinned in this rotation and the anterior, posterior and chamfer cuts are made with the oscillating saw. The intercondylar block is then placed and that cut is made.  Trial size 2.5 tibial component, trial size 3 posterior stabilized femur and a 10  mm posterior stabilized rotating platform insert trial is placed. Full extension is achieved with excellent varus/valgus and anterior/posterior balance throughout full range of motion. The patella is everted and thickness measured to be 22  mm. Free hand resection is taken to 12 mm, a 38 template is placed, lug holes are drilled, trial  patella is placed, and it tracks normally. Osteophytes are removed off the posterior femur with the trial in place. All trials are removed and the cut bone surfaces prepared with pulsatile lavage. Cement is mixed and once ready for implantation, the size 2.5 tibial implant, size  3 posterior stabilized femoral component, and the size 38 patella are cemented in place and the patella is held with the clamp. The trial insert is placed and the knee held in full extension. The Exparel (20 ml mixed with 30 ml saline) and .25% Bupivicaine, are injected into the extensor mechanism, posterior capsule, medial and lateral gutters and subcutaneous tissues.  All extruded cement is removed and once the cement is hard the permanent 10 mm posterior stabilized rotating platform insert is placed into the tibial tray.      The wound is copiously irrigated with saline solution and the extensor mechanism closed over a hemovac drain with #1 V-loc suture. The tourniquet is released for a total tourniquet time of 36  minutes. Flexion against gravity is 140 degrees and the patella tracks normally. Subcutaneous tissue is closed with 2.0 vicryl and subcuticular with running 4.0 Monocryl. The incision is cleaned and dried and steri-strips and a bulky sterile dressing are applied. The limb is placed into a knee immobilizer and the patient is awakened and transported to recovery in stable condition.      Please note that a surgical assistant was a medical necessity for this procedure in order to perform it in a safe and expeditious manner. Surgical assistant was necessary to retract the ligaments and vital neurovascular structures to prevent injury to them and also necessary for proper positioning of the limb to allow for anatomic placement of the prosthesis.   Laura RankinFrank V. Laura Borel, MD    05/16/2015, 8:35 AM

## 2015-05-16 NOTE — Interval H&P Note (Signed)
History and Physical Interval Note:  05/16/2015 7:10 AM  Garwin BrothersSylvia S [name]  has presented today for surgery, with the diagnosis of LEFT KNEE OA   The various methods of treatment have been discussed with the patient and family. After consideration of risks, benefits and other options for treatment, the patient has consented to  Procedure(s): LEFT TOTAL KNEE ARTHROPLASTY (Left) as a surgical intervention .  The patient's history has been reviewed, patient examined, no change in status, stable for surgery.  I have reviewed the patient's chart and labs.  Questions were answered to the patient's satisfaction.     Loanne DrillingALUISIO,Jhordan Mckibben V

## 2015-05-16 NOTE — Anesthesia Procedure Notes (Addendum)
Procedure Name: Intubation Date/Time: 05/16/2015 7:34 AM Performed by: Army FossaPULLIAM, NICHOLAS DANE Pre-anesthesia Checklist: Patient identified, Emergency Drugs available, Suction available, Patient being monitored and Timeout performed Patient Re-evaluated:Patient Re-evaluated prior to inductionOxygen Delivery Method: Circle system utilized Preoxygenation: Pre-oxygenation with 100% oxygen Intubation Type: IV induction Ventilation: Mask ventilation without difficulty and Oral airway inserted - appropriate to patient size Laryngoscope Size: 3 and Mac Grade View: Grade II Tube type: Oral Tube size: 7.0 mm Number of attempts: 1 Airway Equipment and Method: Patient positioned with wedge pillow and Stylet Placement Confirmation: ETT inserted through vocal cords under direct vision,  positive ETCO2,  CO2 detector and breath sounds checked- equal and bilateral Secured at: 21 cm Tube secured with: Tape Dental Injury: Teeth and Oropharynx as per pre-operative assessment    Anesthesia Regional Block:  Adductor canal block  Pre-Anesthetic Checklist: ,, timeout performed, Correct Patient, Correct Site, Correct Laterality, Correct Procedure, Correct Position, site marked, Risks and benefits discussed, Surgical consent,  Pre-op evaluation,  Post-op pain management  Laterality: Left  Prep: chloraprep       Needles:  Injection technique: Single-shot  Needle Type: Stimiplex     Needle Length: 9cm 9 cm Needle Gauge: 21 and 21 G    Additional Needles:  Procedures: ultrasound guided (picture in chart) Adductor canal block Narrative:  Injection made incrementally with aspirations every 5 mL.  Performed by: Personally  Anesthesiologist: Lewie LoronGERMEROTH, Ridhi Hoffert  Additional Notes: BP cuff, EKG monitors applied. Sedation begun. Artery and nerve location verified with U/S and anesthetic injected incrementally, slowly, and after negative aspirations under direct u/s guidance. Good fascial /perineural spread.  Tolerated well.

## 2015-05-16 NOTE — Evaluation (Signed)
Physical Therapy Evaluation Patient Details Name: Laura [name] MRN: [id number] DOB: [birth year] Today's Date: 05/16/2015   History of Present Illness  pt s/p LTKA , with cardiac history ,RTKA in 2010, and history of DDD and cervical spine surgery as well.   Clinical Impression  Pt s/p L TKA presents with decreased ROM, strength, and functional mobility at this time. To benefit from PT to increase safety in order to DC home safely at Supervision level.     Follow Up Recommendations Home health PT    Equipment Recommendations  None recommended by PT    Recommendations for Other Services       Precautions / Restrictions Precautions Precautions: Knee Required Braces or Orthoses: Knee Immobilizer - Left Knee Immobilizer - Left: On when out of bed or walking;Discontinue once straight leg raise with < 10 degree lag Restrictions Weight Bearing Restrictions: No LLE Weight Bearing: Weight bearing as tolerated      Mobility  Bed Mobility Overal bed mobility: Needs Assistance Bed Mobility: Supine to Sit;Sit to Supine     Supine to sit: Min assist     General bed mobility comments: pt used rail and needed only a little asssit with L knee  Transfers Overall transfer level: Needs assistance Equipment used: Rolling walker (2 wheeled) Transfers: Sit to/from Stand Sit to Stand: Min assist         General transfer comment: cues for hand placement and push off for rise.   Ambulation/Gait Ambulation/Gait assistance: Min guard Ambulation Distance (Feet): 40 Feet Assistive device: Rolling walker (2 wheeled) Gait Pattern/deviations: Step-to pattern     General Gait Details: pt was able to do a step through pattern, but continued to instruct pt for today I would like for her to do a step to pattern for safety  due to addcutor block.   Stairs            Wheelchair Mobility    Modified Rankin (Stroke Patients Only)       Balance Overall balance assessment: Needs  assistance         Standing balance support: Bilateral upper extremity supported Standing balance-Leahy Scale: Good                               Pertinent Vitals/Pain Pain Assessment: No/denies pain (states she does not have any knee pain. But tested sensation and all feeling , and muscle activity. )    Home Living Family/patient expects to be discharged to:: Private residence Living Arrangements: Children (pt lives with daughter. They have their own living areas, but same house and will assist as she needs. ) Available Help at Discharge: Family Type of Home: House Home Access: Stairs to enter Entrance Stairs-Rails: Right Entrance Stairs-Number of Steps: 4-5 Home Layout: Two level;Able to live on main level with bedroom/bathroom Home Equipment: Crutches;Cane - single point;Walker - 4 wheels;Walker - 2 wheels      Prior Function Level of Independence: Independent               Hand Dominance        Extremity/Trunk Assessment               Lower Extremity Assessment: LLE deficits/detail   LLE Deficits / Details: limited mostly by bandage. areas 0-70 degrees actively supine .      Communication   Communication: No difficulties  Cognition Arousal/Alertness: Awake/alert Behavior During Therapy: WFL for tasks assessed/performed Overall Cognitive  Status: Within Functional Limits for tasks assessed                      General Comments      Exercises Total Joint Exercises Ankle Circles/Pumps: AROM;5 reps;Both;Supine (educated and pt understood, tried 4 Bil) Quad Sets: AROM;Left;5 reps;Supine Heel Slides: AROM;Supine;Left;5 reps Straight Leg Raises: AROM;Supine;Left;5 reps      Assessment/Plan    PT Assessment Patient needs continued PT services  PT Diagnosis Difficulty walking   PT Problem List Decreased strength;Decreased range of motion;Decreased activity tolerance;Decreased knowledge of use of DME  PT Treatment Interventions  DME instruction;Gait training;Stair training;Functional mobility training;Therapeutic activities;Therapeutic exercise;Patient/family education   PT Goals (Current goals can be found in the Care Plan section) Acute Rehab PT Goals Patient Stated Goal: Iw ant to get home to see my little dog.  PT Goal Formulation: With patient Time For Goal Achievement: 05/30/15 Potential to Achieve Goals: Good    Frequency Min 6X/week   Barriers to discharge        Co-evaluation               End of Session Equipment Utilized During Treatment: Gait belt Activity Tolerance: Patient tolerated treatment well Patient left: in chair;with call bell/phone within reach (wanted to put the chair alarm under pateint and nurse and pateint agreed there was not a need. Patient truely stated, I will call out, there is no way I will try anything oon my own. So no chair alrm was applied. Bed alarm was not on when I arrived  also) Nurse Communication: Mobility status         Time: 1610-9604 PT Time Calculation (min) (ACUTE ONLY): 32 min   Charges:   PT Evaluation $PT Eval Moderate Complexity: 1 Procedure PT Treatments $Gait Training: 8-22 mins   PT G CodesMarella Bile May 19, 2015, 5:21 PM Marella Bile, PT Pager: [phone] 05/19/15

## 2015-05-16 NOTE — Discharge Instructions (Signed)
° °Dr. Joelly Bolanos °Total Joint Specialist °[facility] °[address] °[place], [place] [address] °[phone] ° °TOTAL KNEE REPLACEMENT POSTOPERATIVE DIRECTIONS ° °Knee Rehabilitation, Guidelines Following Surgery  °Results after knee surgery are often greatly improved when you follow the exercise, range of motion and muscle strengthening exercises prescribed by your doctor. Safety measures are also important to protect the knee from further injury. Any time any of these exercises cause you to have increased pain or swelling in your knee joint, decrease the amount until you are comfortable again and slowly increase them. If you have problems or questions, call your caregiver or physical therapist for advice.  ° °HOME CARE INSTRUCTIONS  °Remove items at home which could result in a fall. This includes throw rugs or furniture in walking pathways.  °· ICE to the affected knee every three hours for 30 minutes at a time and then as needed for pain and swelling.  Continue to use ice on the knee for pain and swelling from surgery. You may notice swelling that will progress down to the foot and ankle.  This is normal after surgery.  Elevate the leg when you are not up walking on it.   °· Continue to use the breathing machine which will help keep your temperature down.  It is common for your temperature to cycle up and down following surgery, especially at night when you are not up moving around and exerting yourself.  The breathing machine keeps your lungs expanded and your temperature down. °· Do not place pillow under knee, focus on keeping the knee straight while resting ° °DIET °You may resume your previous home diet once your are discharged from the hospital. ° °DRESSING / WOUND CARE / SHOWERING °You may start showering once you are discharged home but do not submerge the incision under water. Just pat the incision dry and apply a dry gauze dressing on daily. °Change the surgical dressing  daily and reapply a dry dressing each time. ° °ACTIVITY °Walk with your walker as instructed. °Use walker as long as suggested by your caregivers. °Avoid periods of inactivity such as sitting longer than an hour when not asleep. This helps prevent blood clots.  °You may resume a sexual relationship in one month or when given the OK by your doctor.  °You may return to work once you are cleared by your doctor.  °Do not drive a car for 6 weeks or until released by you surgeon.  °Do not drive while taking narcotics. ° °WEIGHT BEARING °Weight bearing as tolerated with assist device (walker, cane, etc) as directed, use it as long as suggested by your surgeon or therapist, typically at least 4-6 weeks. ° °POSTOPERATIVE CONSTIPATION PROTOCOL °Constipation - defined medically as fewer than three stools per week and severe constipation as less than one stool per week. ° °One of the most common issues patients have following surgery is constipation.  Even if you have a regular bowel pattern at home, your normal regimen is likely to be disrupted due to multiple reasons following surgery.  Combination of anesthesia, postoperative narcotics, change in appetite and fluid intake all can affect your bowels.  In order to avoid complications following surgery, here are some recommendations in order to help you during your recovery period. ° °Colace (docusate) - Pick up an over-the-counter form of Colace or another stool softener and take twice a day as long as you are requiring postoperative pain medications.  Take with a full glass of water   daily.  If you experience loose stools or diarrhea, hold the colace until you stool forms back up.  If your symptoms do not get better within 1 week or if they get worse, check with your doctor. ° °Dulcolax (bisacodyl) - Pick up over-the-counter and take as directed by the product packaging as needed to assist with the movement of your bowels.  Take with a full glass of water.  Use this product as  needed if not relieved by Colace only.  ° °MiraLax (polyethylene glycol) - Pick up over-the-counter to have on hand.  MiraLax is a solution that will increase the amount of water in your bowels to assist with bowel movements.  Take as directed and can mix with a glass of water, juice, soda, coffee, or tea.  Take if you go more than two days without a movement. °Do not use MiraLax more than once per day. Call your doctor if you are still constipated or irregular after using this medication for 7 days in a row. ° °If you continue to have problems with postoperative constipation, please contact the office for further assistance and recommendations.  If you experience "the worst abdominal pain ever" or develop nausea or vomiting, please contact the office immediatly for further recommendations for treatment. ° °ITCHING ° If you experience itching with your medications, try taking only a single pain pill, or even half a pain pill at a time.  You can also use Benadryl over the counter for itching or also to help with sleep.  ° °TED HOSE STOCKINGS °Wear the elastic stockings on both legs for three weeks following surgery during the day but you may remove then at night for sleeping. ° °MEDICATIONS °See your medication summary on the “After Visit Summary” that the nursing staff will review with you prior to discharge.  You may have some home medications which will be placed on hold until you complete the course of blood thinner medication.  It is important for you to complete the blood thinner medication as prescribed by your surgeon.  Continue your approved medications as instructed at time of discharge. ° °PRECAUTIONS °If you experience chest pain or shortness of breath - call 911 immediately for transfer to the hospital emergency department.  °If you develop a fever greater that 101 F, purulent drainage from wound, increased redness or drainage from wound, foul odor from the wound/dressing, or calf pain - CONTACT YOUR  SURGEON.   °                                                °FOLLOW-UP APPOINTMENTS °Make sure you keep all of your appointments after your operation with your surgeon and caregivers. You should call the office at the above phone number and make an appointment for approximately two weeks after the date of your surgery or on the date instructed by your surgeon outlined in the "After Visit Summary". ° ° °RANGE OF MOTION AND STRENGTHENING EXERCISES  °Rehabilitation of the knee is important following a knee injury or an operation. After just a few days of immobilization, the muscles of the thigh which control the knee become weakened and shrink (atrophy). Knee exercises are designed to build up the tone and strength of the thigh muscles and to improve knee motion. Often times heat used for twenty to thirty minutes before working out will loosen   up your tissues and help with improving the range of motion but do not use heat for the first two weeks following surgery. These exercises can be done on a training (exercise) mat, on the floor, on a table or on a bed. Use what ever works the best and is most comfortable for you Knee exercises include:  °Leg Lifts - While your knee is still immobilized in a splint or cast, you can do straight leg raises. Lift the leg to 60 degrees, hold for 3 sec, and slowly lower the leg. Repeat 10-20 times 2-3 times daily. Perform this exercise against resistance later as your knee gets better.  °Quad and Hamstring Sets - Tighten up the muscle on the front of the thigh (Quad) and hold for 5-10 sec. Repeat this 10-20 times hourly. Hamstring sets are done by pushing the foot backward against an object and holding for 5-10 sec. Repeat as with quad sets.  °· Leg Slides: Lying on your back, slowly slide your foot toward your buttocks, bending your knee up off the floor (only go as far as is comfortable). Then slowly slide your foot back down until your leg is flat on the floor again. °· Angel Wings:  Lying on your back spread your legs to the side as far apart as you can without causing discomfort.  °A rehabilitation program following serious knee injuries can speed recovery and prevent re-injury in the future due to weakened muscles. Contact your doctor or a physical therapist for more information on knee rehabilitation.  ° °IF YOU ARE TRANSFERRED TO A SKILLED REHAB FACILITY °If the patient is transferred to a skilled rehab facility following release from the hospital, a list of the current medications will be sent to the facility for the patient to continue.  When discharged from the skilled rehab facility, please have the facility set up the patient's Home Health Physical Therapy prior to being released. Also, the skilled facility will be responsible for providing the patient with their medications at time of release from the facility to include their pain medication, the muscle relaxants, and their blood thinner medication. If the patient is still at the rehab facility at time of the two week follow up appointment, the skilled rehab facility will also need to assist the patient in arranging follow up appointment in our office and any transportation needs. ° °MAKE SURE YOU:  °Understand these instructions.  °Get help right away if you are not doing well or get worse.  ° ° °Pick up stool softner and laxative for home use following surgery while on pain medications. °Do not submerge incision under water. °Please use good hand washing techniques while changing dressing each day. °May shower starting three days after surgery. °Please use a clean towel to pat the incision dry following showers. °Continue to use ice for pain and swelling after surgery. °Do not use any lotions or creams on the incision until instructed by your surgeon. ° °

## 2015-05-16 NOTE — Transfer of Care (Signed)
Immediate Anesthesia Transfer of Care Note  Patient: Laura [name]  Procedure(s) Performed: Procedure(s): LEFT TOTAL KNEE ARTHROPLASTY (Left)  Patient Location: PACU  Anesthesia Type:General  Level of Consciousness:  sedated, patient cooperative and responds to stimulation  Airway & Oxygen Therapy:Patient Spontanous Breathing and Patient connected to face mask oxgen  Post-op Assessment:  Report given to PACU RN and Post -op Vital signs reviewed and stable  Post vital signs:  Reviewed and stable  Last Vitals:  Filed Vitals:   05/16/15 0512  BP: 127/61  Pulse: 72  Temp: 36.5 C  Resp: 18    Complications: No apparent anesthesia complications

## 2015-05-16 NOTE — Anesthesia Postprocedure Evaluation (Signed)
Anesthesia Post Note  Patient: Laura [name]  Procedure(s) Performed: Procedure(s) (LRB): LEFT TOTAL KNEE ARTHROPLASTY (Left)  Patient location during evaluation: PACU Anesthesia Type: General Level of consciousness: sedated and patient cooperative Pain management: pain level controlled Vital Signs Assessment: post-procedure vital signs reviewed and stable Respiratory status: spontaneous breathing Cardiovascular status: stable Anesthetic complications: no    Last Vitals:  Filed Vitals:   05/16/15 1005 05/16/15 1022  BP:  117/62  Pulse: 58 64  Temp:  36.3 C  Resp: 11 18    Last Pain:  Filed Vitals:   05/16/15 1246  PainSc: 5                  Lewie LoronJohn Benedetto Ryder

## 2015-05-17 LAB — BASIC METABOLIC PANEL
ANION GAP: 7 (ref 5–15)
BUN: 14 mg/dL (ref 6–20)
CALCIUM: 8.5 mg/dL — AB (ref 8.9–10.3)
CO2: 24 mmol/L (ref 22–32)
Chloride: 108 mmol/L (ref 101–111)
Creatinine, Ser: 0.92 mg/dL (ref 0.44–1.00)
GFR, EST NON AFRICAN AMERICAN: 58 mL/min — AB (ref >60)
Glucose, Bld: 103 mg/dL — ABNORMAL HIGH (ref 65–99)
Potassium: 4.1 mmol/L (ref 3.5–5.1)
SODIUM: 139 mmol/L (ref 135–145)

## 2015-05-17 LAB — CBC
HEMATOCRIT: 25.9 % — AB (ref 36.0–46.0)
Hemoglobin: 8.3 g/dL — ABNORMAL LOW (ref 12.0–15.0)
MCH: 28.5 pg (ref 26.0–34.0)
MCHC: 32 g/dL (ref 30.0–36.0)
MCV: 89 fL (ref 78.0–100.0)
Platelets: 260 10*3/uL (ref 150–400)
RBC: 2.91 MIL/uL — ABNORMAL LOW (ref 3.87–5.11)
RDW: 14.1 % (ref 11.5–15.5)
WBC: 12 10*3/uL — AB (ref 4.0–10.5)

## 2015-05-17 MED ORDER — POLYSACCHARIDE IRON COMPLEX 150 MG PO CAPS
150.0000 mg | ORAL_CAPSULE | Freq: Every day | ORAL | Status: DC
  Administered 2015-05-17 – 2015-05-18 (×2): 150 mg via ORAL
  Filled 2015-05-17 (×2): qty 1

## 2015-05-17 MED ORDER — SODIUM CHLORIDE 0.9 % IV BOLUS (SEPSIS)
250.0000 mL | Freq: Once | INTRAVENOUS | Status: AC
  Administered 2015-05-17: 250 mL via INTRAVENOUS

## 2015-05-17 NOTE — Progress Notes (Signed)
Physical Therapy Treatment Patient Details Name: Laura BrothersSylvia S [name] MRN: [id number] DOB: 1933/06/11 Today's Date: 05/17/2015    History of Present Illness pt s/p LTKA , with cardiac history ,RTKA in 2010, and history of DDD and cervical spine surgery as well.     PT Comments    Pt tolerated session well. O2 a little lower around 90-93% on RA at rest , responds nicely to incentive spirometer. Educated to continue incentive spirometer.    Follow Up Recommendations  Home health PT     Equipment Recommendations  None recommended by PT    Recommendations for Other Services       Precautions / Restrictions Precautions Precautions: Knee Restrictions Weight Bearing Restrictions: No LLE Weight Bearing: Weight bearing as tolerated    Mobility  Bed Mobility Overal bed mobility: Needs Assistance Bed Mobility: Supine to Sit;Sit to Supine     Supine to sit: Supervision     General bed mobility comments: pt used rail   Transfers Overall transfer level: Needs assistance Equipment used: Rolling walker (2 wheeled)   Sit to Stand: Min guard         General transfer comment: cues for hand placement and push off for rise.   Ambulation/Gait   Ambulation Distance (Feet): 150 Feet Assistive device: Rolling walker (2 wheeled) Gait Pattern/deviations: Step-through pattern         Stairs            Wheelchair Mobility    Modified Rankin (Stroke Patients Only)       Balance             Standing balance-Leahy Scale: Good                      Cognition Arousal/Alertness: Awake/alert Behavior During Therapy: WFL for tasks assessed/performed Overall Cognitive Status: Within Functional Limits for tasks assessed                      Exercises Total Joint Exercises Ankle Circles/Pumps: AROM;5 reps;Both;Supine (educated and pt understood, tried 4 Bil) Quad Sets: AROM;Left;Supine;10 reps Heel Slides: AROM;Supine;Left;10 reps Straight Leg  Raises: AROM;Supine;Left;10 reps    General Comments        Pertinent Vitals/Pain Pain Assessment: 0-10 Pain Score: 3  Pain Location: L knee Pain Descriptors / Indicators: Aching Pain Intervention(s): Monitored during session;Patient requesting pain meds-RN notified    Home Living                      Prior Function            PT Goals (current goals can now be found in the care plan section) Acute Rehab PT Goals Patient Stated Goal: Iw ant to get home to see my little dog.  PT Goal Formulation: With patient Time For Goal Achievement: 05/30/15 Potential to Achieve Goals: Good    Frequency  7X/week    PT Plan      Co-evaluation             End of Session Equipment Utilized During Treatment: Gait belt Activity Tolerance: Patient tolerated treatment well Patient left: with call bell/phone within reach;in bed (wanted to put the chair alarm under pateint and nurse and pateint agreed there was not a need. Patient truely stated, I will call out, there is no way I will try anything oon my own. So no chair alrm was applied. Bed alarm was not on when I arrived  also)  Time: 1610-9604 PT Time Calculation (min) (ACUTE ONLY): 16 min  Charges:  $Gait Training: 8-22 mins                    G CodesMarella [name] 30-May-2015, 4:26 PM  Laura [name], PT Pager: [phone] 2015/05/30

## 2015-05-17 NOTE — Progress Notes (Signed)
Physical Therapy Treatment Patient Details Name: Laura [name] MRN: [id number] DOB: [birth year] Today's Date: 05/17/2015    History of Present Illness pt s/p LTKA , with cardiac history ,RTKA in 2010, and history of DDD and cervical spine surgery as well.     PT Comments    Pt tolerated session well. Aware Hbg low today and low BP, however not symptomatic with session today and BP increases slightly with sitting and ambulation.   Follow Up Recommendations  Home health PT     Equipment Recommendations  None recommended by PT    Recommendations for Other Services       Precautions / Restrictions Precautions Precautions: Knee Required Braces or Orthoses:  (did not use KI while walkign today, pt with great 10x SLR and movment. ) Restrictions Weight Bearing Restrictions: No LLE Weight Bearing: Weight bearing as tolerated    Mobility  Bed Mobility Overal bed mobility: Needs Assistance Bed Mobility: Supine to Sit;Sit to Supine     Supine to sit: Min guard     General bed mobility comments: pt used rail   Transfers Overall transfer level: Needs assistance Equipment used: Rolling walker (2 wheeled)   Sit to Stand: Min assist         General transfer comment: cues for hand placement and push off for rise.   Ambulation/Gait   Ambulation Distance (Feet): 100 Feet Assistive device: Rolling walker (2 wheeled) Gait Pattern/deviations: Step-to pattern     General Gait Details: pt with step to pattern today, but easily could perfrom step through, just limiting today to assure pain under control .    Stairs            Wheelchair Mobility    Modified Rankin (Stroke Patients Only)       Balance             Standing balance-Leahy Scale: Good                      Cognition Arousal/Alertness: Awake/alert Behavior During Therapy: WFL for tasks assessed/performed Overall Cognitive Status: Within Functional Limits for tasks assessed                       Exercises Total Joint Exercises Ankle Circles/Pumps: AROM;5 reps;Both;Supine (educated and pt understood, tried 4 Bil) Quad Sets: AROM;Left;Supine;10 reps Heel Slides: AROM;Supine;Left;10 reps Hip ABduction/ADduction: AROM;Supine;10 reps Straight Leg Raises: AROM;Supine;Left;10 reps    General Comments        Pertinent Vitals/Pain Pain Assessment: 0-10 Pain Score: 3  Pain Location: knee, had taken tramadal , but after session pt asked for something else Pain Descriptors / Indicators: Aching Pain Intervention(s): Monitored during session;Premedicated before session;Patient requesting pain meds-RN notified;Ice applied    Home Living                      Prior Function            PT Goals (current goals can now be found in the care plan section) Acute Rehab PT Goals Patient Stated Goal: Iw ant to get home to see my little dog.  PT Goal Formulation: With patient Time For Goal Achievement: 05/30/15 Potential to Achieve Goals: Good    Frequency  Min 6X/week    PT Plan      Co-evaluation             End of Session Equipment Utilized During Treatment: Gait belt Activity Tolerance: Patient tolerated treatment  well Patient left: in chair;with call bell/phone within reach (wanted to put the chair alarm under pateint and nurse and pateint agreed there was not a need. Patient truely stated, I will call out, there is no way I will try anything oon my own. So no chair alrm was applied. Bed alarm was not on when I arrived  also)     Time: 1100-1130 PT Time Calculation (min) (ACUTE ONLY): 30 min  Charges:  $Gait Training: 8-22 mins $Therapeutic Activity: 8-22 mins                    G CodesMarella Bile 05-27-15, 12:27 PM  Marella Bile, PT Pager: [phone] May 27, 2015

## 2015-05-17 NOTE — Progress Notes (Signed)
   Subjective: 1 Day Post-Op Procedure(s) (LRB): LEFT TOTAL KNEE ARTHROPLASTY (Left) Patient reports pain as mild.   Patient seen in rounds with Dr. Lequita HaltAluisio. Blood pressures have been running on the lower side since last night. Will give fluids this morning and recheck BP.  HGB down to 8.3.  (Preop HGB was 11.9).  Add iron supplement. Patient is well, but has had some minor complaints of pain in the knee, requiring pain medications We will start therapy today.  See how she does with therapy.  Briefly discussed transfusion if HGB goes lower and symptoms develop. Plan is to go Home after hospital stay.  Objective: Vital signs in last 24 hours: Temp:  [97.3 F (36.3 C)-97.9 F (36.6 C)] 97.9 F (36.6 C) (03/11 0505) Pulse Rate:  [56-70] 61 (03/11 0505) Resp:  [9-18] 16 (03/11 0505) BP: (96-130)/(43-64) 96/45 mmHg (03/11 0505) SpO2:  [93 %-100 %] 96 % (03/11 0505)  Intake/Output from previous day:  Intake/Output Summary (Last 24 hours) at 05/17/15 0748 Last data filed at 05/17/15 0740  Gross per 24 hour  Intake 3639.17 ml  Output   1750 ml  Net 1889.17 ml    Intake/Output this shift: Total I/O In: 240 [P.O.:240] Out: -   Labs:  Recent Labs  05/17/15 0553  HGB 8.3*    Recent Labs  05/17/15 0553  WBC 12.0*  RBC 2.91*  HCT 25.9*  PLT 260   No results for input(s): NA, K, CL, CO2, BUN, CREATININE, GLUCOSE, CALCIUM in the last 72 hours. No results for input(s): LABPT, INR in the last 72 hours.  EXAM General - Patient is Alert, Appropriate and Oriented Extremity - Neurovascular intact Sensation intact distally Dorsiflexion/Plantar flexion intact Dressing - dressing C/D/I Motor Function - intact, moving foot and toes well on exam.  Hemovac pulled without difficulty.  Past Medical History  Diagnosis Date  . CAD (coronary artery disease)     s/p PCI  . HTN (hypertension)   . HLD (hyperlipidemia)   . Osteoarthritis   . Hypothyroidism   . MI (myocardial  infarction) (HCC) 2005,2006,2008    x3    Assessment/Plan: 1 Day Post-Op Procedure(s) (LRB): LEFT TOTAL KNEE ARTHROPLASTY (Left) Principal Problem:   OA (osteoarthritis) of knee  Estimated body mass index is 29.32 kg/(m^2) as calculated from the following:   Height as of this encounter: 5\' 6"  (1.676 m).   Weight as of this encounter: 82.373 kg (181 lb 9.6 oz). Advance diet Up with therapy Plan for discharge tomorrow Discharge home with home health  Fluid bolus this morning and then recheck BP. Foley out after first session of therapy. Parameter on the Norvasc and hold Altace for now  DVT Prophylaxis - Aspirin and Plavix Weight-Bearing as tolerated to left leg D/C O2 and Pulse OX and try on Room Air  Avel Peacerew Thao Vanover, PA-C Orthopaedic Surgery 05/17/2015, 7:48 AM

## 2015-05-18 LAB — BASIC METABOLIC PANEL
ANION GAP: 9 (ref 5–15)
BUN: 15 mg/dL (ref 6–20)
CALCIUM: 8.6 mg/dL — AB (ref 8.9–10.3)
CO2: 26 mmol/L (ref 22–32)
Chloride: 108 mmol/L (ref 101–111)
Creatinine, Ser: 0.9 mg/dL (ref 0.44–1.00)
GFR, EST NON AFRICAN AMERICAN: 59 mL/min — AB (ref >60)
Glucose, Bld: 98 mg/dL (ref 65–99)
Potassium: 4.2 mmol/L (ref 3.5–5.1)
SODIUM: 143 mmol/L (ref 135–145)

## 2015-05-18 LAB — CBC
HEMATOCRIT: 26 % — AB (ref 36.0–46.0)
Hemoglobin: 8.2 g/dL — ABNORMAL LOW (ref 12.0–15.0)
MCH: 29.3 pg (ref 26.0–34.0)
MCHC: 31.5 g/dL (ref 30.0–36.0)
MCV: 92.9 fL (ref 78.0–100.0)
PLATELETS: 251 10*3/uL (ref 150–400)
RBC: 2.8 MIL/uL — ABNORMAL LOW (ref 3.87–5.11)
RDW: 14.7 % (ref 11.5–15.5)
WBC: 11.9 10*3/uL — AB (ref 4.0–10.5)

## 2015-05-18 MED ORDER — POLYSACCHARIDE IRON COMPLEX 150 MG PO CAPS: 150.0000 mg | ORAL_CAPSULE | Freq: Every day | ORAL | Status: DC

## 2015-05-18 NOTE — Evaluation (Addendum)
Occupational Therapy Evaluation Patient Details Name: Laura [name] MRN: [id number] DOB: 20-May-1933 Today's Date: 05/18/2015    History of Present Illness pt s/p LTKA , with cardiac history ,RTKA in 2010, and history of DDD and cervical spine surgery as well.    Clinical Impression   Pt doing well overall. Needs min verbal cues for safety with walker use and functional transfers. Did better as session progressed. Pt for d/c later today and daughter can assist PRN at d/c. Will need 3in1.    Follow Up Recommendations  No OT follow up;Supervision/Assistance - 24 hour    Equipment Recommendations  3 in 1 bedside comode    Recommendations for Other Services       Precautions / Restrictions Precautions Precautions: Knee Required Braces or Orthoses:  (did SLR) Restrictions Weight Bearing Restrictions: No LLE Weight Bearing: Weight bearing as tolerated      Mobility Bed Mobility Overal bed mobility: Needs Assistance Bed Mobility: Supine to Sit     Supine to sit: Supervision;HOB elevated        Transfers Overall transfer level: Needs assistance Equipment used: Rolling walker (2 wheeled) Transfers: Sit to/from Stand Sit to Stand: Min guard;Min assist         General transfer comment: verbal cues for hand placement.     Balance                                            ADL Overall ADL's : Needs assistance/impaired Eating/Feeding: Independent;Sitting   Grooming: Wash/dry hands;Min guard;Standing   Upper Body Bathing: Set up;Sitting   Lower Body Bathing: Minimal assistance;Sit to/from stand   Upper Body Dressing : Set up;Sitting   Lower Body Dressing: Moderate assistance;Sit to/from stand   Toilet Transfer: Minimal assistance;Ambulation;BSC;RW   Toileting- Architect and Hygiene: Min guard;Sit to/from stand         General ADL Comments: Pt states she will sponge bathe initially at home. She has a tubseat for when she  is ready to shower and discussed letting HH assess tub transfer before she attempts. She initially tried to pull up using walker to standing so cautioned pt and had her practice pushing up with one hand on the EOB. She also tends to want to sit down before fully backed up to chair so educated her on importance of fully backing up to surface before letting go of walker and she did better with practice. She has used a Sports administrator in the past but states she is not currently interested in other AE and daughter will assist PRN with LB dressing.      Vision     Perception     Praxis      Pertinent Vitals/Pain Pain Assessment: 0-10 Pain Score: 3  Pain Location: L knee Pain Descriptors / Indicators: Aching Pain Intervention(s): Monitored during session;Repositioned;Ice applied     Hand Dominance     Extremity/Trunk Assessment Upper Extremity Assessment Upper Extremity Assessment: Overall WFL for tasks assessed           Communication Communication Communication: No difficulties   Cognition Arousal/Alertness: Awake/alert Behavior During Therapy: WFL for tasks assessed/performed Overall Cognitive Status: Within Functional Limits for tasks assessed                     General Comments       Exercises  Shoulder Instructions      Home Living Family/patient expects to be discharged to:: Private residence Living Arrangements: Children (pt lives with daughter. They have their own living areas, but same house and will assist as she needs. ) Available Help at Discharge: Family Type of Home: House Home Access: Stairs to enter Entergy CorporationEntrance Stairs-Number of Steps: 4-5 Entrance Stairs-Rails: Right Home Layout: Two level;Able to live on main level with bedroom/bathroom     Bathroom Shower/Tub: Tub/shower unit   Bathroom Toilet: Handicapped height     Home Equipment: Crutches;Cane - single point;Walker - 4 wheels;Walker - 2 wheels;Adaptive equipment Adaptive Equipment:  Reacher        Prior Functioning/Environment Level of Independence: Independent             OT Diagnosis: Generalized weakness   OT Problem List:     OT Treatment/Interventions: Self-care/ADL training;DME and/or AE instruction;Patient/family education;Therapeutic activities    OT Goals(Current goals can be found in the care plan section) Acute Rehab OT Goals Patient Stated Goal: home later OT Goal Formulation: With patient Time For Goal Achievement: 05/25/15 Potential to Achieve Goals: Good  OT Frequency: Min 2X/week   Barriers to D/C:            Co-evaluation              End of Session Equipment Utilized During Treatment: Rolling walker  Activity Tolerance: Patient tolerated treatment well Patient left: in chair;with call bell/phone within reach   Time: 0937-1017 OT Time Calculation (min): 40 min Charges:  OT General Charges $OT Visit: 1 Procedure OT Evaluation $OT Eval Low Complexity: 1 Procedure OT Treatments $Self Care/Home Management : 8-22 mins $Therapeutic Activity: 8-22 mins G-Codes:    Lennox LaityStone, Laura [name]  [phone] 05/18/2015, 10:25 AM

## 2015-05-18 NOTE — Care Management Note (Addendum)
Case Management Note  Patient Details  Name: Laura [name] MRN: [id number] Date of Birth: 1933/06/22  Subjective/Objective:  pt s/p LTKA                   Action/Plan: Discharge Planning: AVS reviewed NCM spoke to pt at bedside. States she has RW at home. Requesting 3n1 for home. Contacted [facility] DME rep for 3n1 for home. Offered choice/HH list provided. [facility]tr, Charlette CaffeyKim Soban at home to assist with care as needed.    Almetta LovelyPCP-BEANE, LORI M MD  Expected Discharge Date:  05/18/15               Expected Discharge Plan:  Home w Home Health Services  In-House Referral:  NA  Discharge planning Services  CM Consult  Post Acute Care Choice:  Home Health Choice offered to:  Patient  DME Arranged:  3-N-1 DME Agency:  [facility]  [facility]  Status of Service:  Completed, signed off  Medicare Important Message Given:    Date Medicare IM Given:    Medicare IM give by:    Date Additional Medicare IM Given:    Additional Medicare Important Message give by:     If discussed at Long Length of Stay Meetings, dates discussed:    Additional Comments:  Elliot CousinShavis, Chrissy Ealey Ellen, RN 05/18/2015, 11:31 AM

## 2015-05-18 NOTE — Progress Notes (Signed)
   Subjective: 2 Days Post-Op Procedure(s) (LRB): LEFT TOTAL KNEE ARTHROPLASTY (Left)  Pt doing well Mild pain/soreness in the left knee Ready for d/c home Patient reports pain as mild.  Objective:   VITALS:   Filed Vitals:   05/17/15 2054 05/18/15 0529  BP: 108/50 110/55  Pulse: 57 66  Temp: 98.2 F (36.8 C) 98 F (36.7 C)  Resp: 17 16    Left knee incision healing well nv intact distally No rashes or edema   LABS  Recent Labs  05/17/15 0553 05/18/15 0613  HGB 8.3* 8.2*  HCT 25.9* 26.0*  WBC 12.0* 11.9*  PLT 260 251     Recent Labs  05/17/15 0553 05/18/15 0613  NA 139 143  K 4.1 4.2  BUN 14 15  CREATININE 0.92 0.90  GLUCOSE 103* 98     Assessment/Plan: 2 Days Post-Op Procedure(s) (LRB): LEFT TOTAL KNEE ARTHROPLASTY (Left) Acute blood loss anemia - stable currently, no symptoms Plan for d/c home today after therapy F/u in 2 weeks    Alphonsa OverallBrad Shadd Dunstan, MPAS, PA-C  05/18/2015, 8:33 AM

## 2015-05-18 NOTE — Progress Notes (Signed)
Physical Therapy Treatment Patient Details Name: Laura [name] MRN: [id number] DOB: Mar 11, 1933 Today's Date: 05/18/2015    History of Present Illness pt s/p LTKA , with cardiac history ,RTKA in 2010, and history of DDD and cervical spine surgery as well.     PT Comments    Pt making excellent progress  Follow Up Recommendations  Home health PT     Equipment Recommendations  None recommended by PT    Recommendations for Other Services       Precautions / Restrictions Precautions Precautions: Knee Required Braces or Orthoses:  (did SLR) Restrictions Weight Bearing Restrictions: No LLE Weight Bearing: Weight bearing as tolerated    Mobility  Bed Mobility Overal bed mobility: Needs Assistance Bed Mobility: Supine to Sit     Supine to sit: Supervision;HOB elevated     General bed mobility comments: OOB  Transfers Overall transfer level: Needs assistance Equipment used: Rolling walker (2 wheeled) Transfers: Sit to/from Stand Sit to Stand: Min guard         General transfer comment: pt self corrects for hand placement  Ambulation/Gait Ambulation/Gait assistance: Supervision Ambulation Distance (Feet): 220 Feet Assistive device: Rolling walker (2 wheeled) Gait Pattern/deviations: Step-through pattern;Decreased weight shift to left Gait velocity: .67m/sec   General Gait Details: good progression to step through gait   Stairs Stairs: Yes Stairs assistance: Min guard Stair Management: Two rails Number of Stairs: 2 General stair comments: cues for sequence  Wheelchair Mobility    Modified Rankin (Stroke Patients Only)       Balance                                    Cognition Arousal/Alertness: Awake/alert Behavior During Therapy: WFL for tasks assessed/performed Overall Cognitive Status: Within Functional Limits for tasks assessed                      Exercises Total Joint Exercises Ankle Circles/Pumps: AROM;Both;10  reps Quad Sets: Both;AROM;10 reps Heel Slides: AROM;Left;10 reps Straight Leg Raises: AROM;Left;10 reps    General Comments        Pertinent Vitals/Pain Pain Assessment: 0-10 Pain Score: 3  Pain Location: L knee Pain Descriptors / Indicators: Operative site guarding Pain Intervention(s): Limited activity within patient's tolerance;Monitored during session;Premedicated before session    Home Living Family/patient expects to be discharged to:: Private residence Living Arrangements: Children (pt lives with daughter. They have their own living areas, but same house and will assist as she needs. ) Available Help at Discharge: Family Type of Home: House Home Access: Stairs to enter Entrance Stairs-Rails: Right Home Layout: Two level;Able to live on main level with bedroom/bathroom Home Equipment: Crutches;Cane - single point;Walker - 4 wheels;Walker - 2 wheels;Adaptive equipment      Prior Function Level of Independence: Independent          PT Goals (current goals can now be found in the care plan section) Acute Rehab PT Goals Patient Stated Goal: home later PT Goal Formulation: With patient Time For Goal Achievement: 05/30/15 Potential to Achieve Goals: Good Progress towards PT goals: Progressing toward goals    Frequency  7X/week    PT Plan Current plan remains appropriate    Co-evaluation             End of Session Equipment Utilized During Treatment: Gait belt Activity Tolerance: Patient tolerated treatment well Patient left: with call bell/phone within reach;in  bed     Time: 1059-1120 PT Time Calculation (min) (ACUTE ONLY): 21 min  Charges:  $Gait Training: 8-22 mins                    G Codes:      Laura [name] 05/18/2015, 11:47 AM

## 2015-05-18 NOTE — Discharge Summary (Signed)
Physician Discharge Summary   Patient ID: Laura [name] MRN: [id number] DOB/AGE: 1933/03/24 82 y.o.  Admit date: 05/16/2015 Discharge date: 05/18/2015  Admission Diagnoses:  Principal Problem:   OA (osteoarthritis) of knee   Discharge Diagnoses:  Same   Surgeries: Procedure(s): LEFT TOTAL KNEE ARTHROPLASTY on 05/16/2015   Consultants: PT/OT  Discharged Condition: Stable  Hospital Course: Laura [name] is an 82 y.o. female who was admitted 05/16/2015 with a chief complaint of No chief complaint on file. , and found to have a diagnosis of OA (osteoarthritis) of knee.  They were brought to the operating room on 05/16/2015 and underwent the above named procedures.    The patient had an uncomplicated hospital course and was stable for discharge.  Recent vital signs:  Filed Vitals:   05/17/15 2054 05/18/15 0529  BP: 108/50 110/55  Pulse: 57 66  Temp: 98.2 F (36.8 C) 98 F (36.7 C)  Resp: 17 16    Recent laboratory studies:  Results for orders placed or performed during the hospital encounter of 05/16/15  CBC  Result Value Ref Range   WBC 12.0 (H) 4.0 - 10.5 K/uL   RBC 2.91 (L) 3.87 - 5.11 MIL/uL   Hemoglobin 8.3 (L) 12.0 - 15.0 g/dL   HCT 78.225.9 (L) 95.636.0 - 21.346.0 %   MCV 89.0 78.0 - 100.0 fL   MCH 28.5 26.0 - 34.0 pg   MCHC 32.0 30.0 - 36.0 g/dL   RDW 08.614.1 57.811.5 - 46.915.5 %   Platelets 260 150 - 400 K/uL  Basic metabolic panel  Result Value Ref Range   Sodium 139 135 - 145 mmol/L   Potassium 4.1 3.5 - 5.1 mmol/L   Chloride 108 101 - 111 mmol/L   CO2 24 22 - 32 mmol/L   Glucose, Bld 103 (H) 65 - 99 mg/dL   BUN 14 6 - 20 mg/dL   Creatinine, Ser 6.290.92 0.44 - 1.00 mg/dL   Calcium 8.5 (L) 8.9 - 10.3 mg/dL   GFR calc non Af Amer 58 (L) >60 mL/min   GFR calc Af Amer >60 >60 mL/min   Anion gap 7 5 - 15  CBC  Result Value Ref Range   WBC 11.9 (H) 4.0 - 10.5 K/uL   RBC 2.80 (L) 3.87 - 5.11 MIL/uL   Hemoglobin 8.2 (L) 12.0 - 15.0 g/dL   HCT 52.826.0 (L) 41.336.0 - 24.446.0 %   MCV  92.9 78.0 - 100.0 fL   MCH 29.3 26.0 - 34.0 pg   MCHC 31.5 30.0 - 36.0 g/dL   RDW 01.014.7 27.211.5 - 53.615.5 %   Platelets 251 150 - 400 K/uL  Basic metabolic panel  Result Value Ref Range   Sodium 143 135 - 145 mmol/L   Potassium 4.2 3.5 - 5.1 mmol/L   Chloride 108 101 - 111 mmol/L   CO2 26 22 - 32 mmol/L   Glucose, Bld 98 65 - 99 mg/dL   BUN 15 6 - 20 mg/dL   Creatinine, Ser 6.440.90 0.44 - 1.00 mg/dL   Calcium 8.6 (L) 8.9 - 10.3 mg/dL   GFR calc non Af Amer 59 (L) >60 mL/min   GFR calc Af Amer >60 >60 mL/min   Anion gap 9 5 - 15    Discharge Medications:     Medication List    STOP taking these medications        ADVIL 200 MG Caps  Generic drug:  Ibuprofen      TAKE these medications  amLODipine 5 MG tablet  Commonly known as:  NORVASC  Take 5 mg by mouth daily.     aspirin 81 MG tablet  Take 81 mg by mouth daily.     clopidogrel 75 MG tablet  Commonly known as:  PLAVIX  Take 75 mg by mouth daily.     HYDROmorphone 2 MG tablet  Commonly known as:  DILAUDID  Take 1-2 tablets (2-4 mg total) by mouth every 4 (four) hours as needed for severe pain.     levothyroxine 125 MCG tablet  Commonly known as:  SYNTHROID, LEVOTHROID  Take 125 mcg by mouth daily.     methocarbamol 500 MG tablet  Commonly known as:  ROBAXIN  Take 1 tablet (500 mg total) by mouth every 6 (six) hours as needed for muscle spasms.     metoprolol succinate 50 MG 24 hr tablet  Commonly known as:  TOPROL-XL  Take 50 mg by mouth daily.     nitroGLYCERIN 0.4 MG SL tablet  Commonly known as:  NITROSTAT  Place 0.4 mg under the tongue every 5 (five) minutes as needed.     ramipril 2.5 MG capsule  Commonly known as:  ALTACE  Take 2.5 mg by mouth daily.     rosuvastatin 5 MG tablet  Commonly known as:  CRESTOR  Take 5 mg by mouth daily.     senna 8.6 MG tablet  Commonly known as:  SENOKOT  Take 2 tablets by mouth daily.     traMADol 50 MG tablet  Commonly known as:  ULTRAM  Take 1-2 tablets  (50-100 mg total) by mouth every 6 (six) hours as needed for moderate pain.     ZZZQUIL PO  Take 30 mLs by mouth at bedtime as needed (Sleep).        Diagnostic Studies: No results found.  Disposition: 01-Home or Self Care        Follow-up Information    Follow up with Loanne Drilling, MD. Schedule an appointment as soon as possible for a visit on 05/29/2015.   Specialty:  Orthopedic Surgery   Why:  Call [phone] Monday to make the appointment   Contact information:   [address] [place] [address] [phone]        Signed: Thea Gist 05/18/2015, 8:34 AM

## 2015-05-27 ENCOUNTER — Emergency Department (HOSPITAL_BASED_OUTPATIENT_CLINIC_OR_DEPARTMENT_OTHER): Payer: Medicare Other

## 2015-05-27 ENCOUNTER — Encounter (HOSPITAL_BASED_OUTPATIENT_CLINIC_OR_DEPARTMENT_OTHER): Payer: Self-pay | Admitting: Emergency Medicine

## 2015-05-27 ENCOUNTER — Observation Stay (HOSPITAL_COMMUNITY): Payer: Medicare Other

## 2015-05-27 ENCOUNTER — Observation Stay (HOSPITAL_BASED_OUTPATIENT_CLINIC_OR_DEPARTMENT_OTHER)
Admission: EM | Admit: 2015-05-27 | Discharge: 2015-05-28 | Disposition: A | Discharge status: Home / Self Care | Payer: Medicare Other | Attending: Internal Medicine | Admitting: Internal Medicine

## 2015-05-27 DIAGNOSIS — M6281 Muscle weakness (generalized): Secondary | ICD-10-CM | POA: Diagnosis present

## 2015-05-27 DIAGNOSIS — E785 Hyperlipidemia, unspecified: Secondary | ICD-10-CM | POA: Diagnosis present

## 2015-05-27 DIAGNOSIS — K5900 Constipation, unspecified: Secondary | ICD-10-CM

## 2015-05-27 DIAGNOSIS — I2510 Atherosclerotic heart disease of native coronary artery without angina pectoris: Secondary | ICD-10-CM | POA: Diagnosis present

## 2015-05-27 DIAGNOSIS — R531 Weakness: Secondary | ICD-10-CM

## 2015-05-27 DIAGNOSIS — I471 Supraventricular tachycardia: Secondary | ICD-10-CM | POA: Diagnosis not present

## 2015-05-27 DIAGNOSIS — R8271 Bacteriuria: Secondary | ICD-10-CM

## 2015-05-27 DIAGNOSIS — I491 Atrial premature depolarization: Secondary | ICD-10-CM

## 2015-05-27 DIAGNOSIS — N390 Urinary tract infection, site not specified: Secondary | ICD-10-CM | POA: Diagnosis not present

## 2015-05-27 DIAGNOSIS — M1710 Unilateral primary osteoarthritis, unspecified knee: Secondary | ICD-10-CM | POA: Diagnosis present

## 2015-05-27 DIAGNOSIS — E039 Hypothyroidism, unspecified: Secondary | ICD-10-CM | POA: Diagnosis present

## 2015-05-27 DIAGNOSIS — M1712 Unilateral primary osteoarthritis, left knee: Secondary | ICD-10-CM | POA: Diagnosis not present

## 2015-05-27 DIAGNOSIS — I10 Essential (primary) hypertension: Secondary | ICD-10-CM | POA: Diagnosis present

## 2015-05-27 DIAGNOSIS — I4719 Other supraventricular tachycardia: Secondary | ICD-10-CM | POA: Diagnosis present

## 2015-05-27 DIAGNOSIS — M179 Osteoarthritis of knee, unspecified: Secondary | ICD-10-CM | POA: Diagnosis present

## 2015-05-27 LAB — COMPREHENSIVE METABOLIC PANEL
ALT: 19 U/L (ref 14–54)
ANION GAP: 9 (ref 5–15)
AST: 29 U/L (ref 15–41)
Albumin: 3.6 g/dL (ref 3.5–5.0)
Alkaline Phosphatase: 100 U/L (ref 38–126)
BUN: 15 mg/dL (ref 6–20)
CHLORIDE: 102 mmol/L (ref 101–111)
CO2: 26 mmol/L (ref 22–32)
Calcium: 8.8 mg/dL — ABNORMAL LOW (ref 8.9–10.3)
Creatinine, Ser: 1 mg/dL (ref 0.44–1.00)
GFR calc Af Amer: >60 mL/min (ref >60)
GFR, EST NON AFRICAN AMERICAN: 52 mL/min — AB (ref >60)
Glucose, Bld: 124 mg/dL — ABNORMAL HIGH (ref 65–99)
POTASSIUM: 3.7 mmol/L (ref 3.5–5.1)
Sodium: 137 mmol/L (ref 135–145)
TOTAL PROTEIN: 6.8 g/dL (ref 6.5–8.1)
Total Bilirubin: 0.9 mg/dL (ref 0.3–1.2)

## 2015-05-27 LAB — URINALYSIS, ROUTINE W REFLEX MICROSCOPIC
Bilirubin Urine: NEGATIVE
GLUCOSE, UA: NEGATIVE mg/dL
Hgb urine dipstick: NEGATIVE
KETONES UR: NEGATIVE mg/dL
NITRITE: NEGATIVE
PROTEIN: NEGATIVE mg/dL
Specific Gravity, Urine: 1.014 (ref 1.005–1.030)
pH: 6 (ref 5.0–8.0)

## 2015-05-27 LAB — URINE MICROSCOPIC-ADD ON

## 2015-05-27 LAB — CBC
HCT: 28.6 % — ABNORMAL LOW (ref 36.0–46.0)
Hemoglobin: 9 g/dL — ABNORMAL LOW (ref 12.0–15.0)
MCH: 29.7 pg (ref 26.0–34.0)
MCHC: 31.5 g/dL (ref 30.0–36.0)
MCV: 94.4 fL (ref 78.0–100.0)
PLATELETS: 391 10*3/uL (ref 150–400)
RBC: 3.03 MIL/uL — ABNORMAL LOW (ref 3.87–5.11)
RDW: 15.3 % (ref 11.5–15.5)
WBC: 10.1 10*3/uL (ref 4.0–10.5)

## 2015-05-27 LAB — TROPONIN I

## 2015-05-27 MED ORDER — ASPIRIN EC 81 MG PO TBEC
81.0000 mg | DELAYED_RELEASE_TABLET | Freq: Every day | ORAL | Status: DC
  Administered 2015-05-28: 81 mg via ORAL
  Filled 2015-05-27: qty 1

## 2015-05-27 MED ORDER — LEVOTHYROXINE SODIUM 25 MCG PO TABS
125.0000 ug | ORAL_TABLET | Freq: Every day | ORAL | Status: DC
  Administered 2015-05-28: 125 ug via ORAL
  Filled 2015-05-27: qty 1

## 2015-05-27 MED ORDER — CLOPIDOGREL BISULFATE 75 MG PO TABS
75.0000 mg | ORAL_TABLET | Freq: Every day | ORAL | Status: DC
  Administered 2015-05-28: 75 mg via ORAL
  Filled 2015-05-27: qty 1

## 2015-05-27 MED ORDER — SENNOSIDES 8.6 MG PO TABS: 2.0000 | ORAL_TABLET | Freq: Every day | ORAL | Status: DC

## 2015-05-27 MED ORDER — METHOCARBAMOL 500 MG PO TABS: 500.0000 mg | ORAL_TABLET | Freq: Four times a day (QID) | ORAL | Status: DC | PRN

## 2015-05-27 MED ORDER — POLYSACCHARIDE IRON COMPLEX 150 MG PO CAPS
150.0000 mg | ORAL_CAPSULE | Freq: Every day | ORAL | Status: DC
  Filled 2015-05-27: qty 1

## 2015-05-27 MED ORDER — SODIUM CHLORIDE 0.9 % IV SOLN
INTRAVENOUS | Status: DC
  Administered 2015-05-28: via INTRAVENOUS

## 2015-05-27 MED ORDER — RAMIPRIL 2.5 MG PO CAPS: 2.5000 mg | ORAL_CAPSULE | Freq: Every day | ORAL | Status: DC

## 2015-05-27 MED ORDER — HYDROMORPHONE HCL 2 MG PO TABS
1.0000 mg | ORAL_TABLET | ORAL | Status: DC | PRN
  Administered 2015-05-27 – 2015-05-28 (×2): 2 mg via ORAL
  Filled 2015-05-27 (×2): qty 1

## 2015-05-27 MED ORDER — SENNA 8.6 MG PO TABS
2.0000 | ORAL_TABLET | Freq: Every day | ORAL | Status: DC
  Administered 2015-05-28: 17.2 mg via ORAL
  Filled 2015-05-27: qty 2

## 2015-05-27 MED ORDER — ONDANSETRON HCL 4 MG/2ML IJ SOLN: 4.0000 mg | Freq: Four times a day (QID) | INTRAMUSCULAR | Status: DC | PRN

## 2015-05-27 MED ORDER — TRAMADOL HCL 50 MG PO TABS: 50.0000 mg | ORAL_TABLET | Freq: Four times a day (QID) | ORAL | Status: DC | PRN

## 2015-05-27 MED ORDER — ENOXAPARIN SODIUM 40 MG/0.4ML ~~LOC~~ SOLN
40.0000 mg | SUBCUTANEOUS | Status: DC
  Filled 2015-05-27: qty 0.4

## 2015-05-27 MED ORDER — ASPIRIN 81 MG PO TABS: 81.0000 mg | ORAL_TABLET | Freq: Every day | ORAL | Status: DC

## 2015-05-27 MED ORDER — SODIUM CHLORIDE 0.9 % IV BOLUS (SEPSIS)
1000.0000 mL | Freq: Once | INTRAVENOUS | Status: AC
  Administered 2015-05-27: 1000 mL via INTRAVENOUS

## 2015-05-27 MED ORDER — RAMIPRIL 2.5 MG PO CAPS
2.5000 mg | ORAL_CAPSULE | Freq: Every day | ORAL | Status: DC
  Administered 2015-05-28: 2.5 mg via ORAL
  Filled 2015-05-27: qty 1

## 2015-05-27 MED ORDER — HYDROMORPHONE HCL 2 MG PO TABS
2.0000 mg | ORAL_TABLET | ORAL | Status: DC | PRN
Start: 2015-05-27 — End: 2015-05-27

## 2015-05-27 MED ORDER — METOPROLOL SUCCINATE ER 50 MG PO TB24
50.0000 mg | ORAL_TABLET | Freq: Every day | ORAL | Status: DC
  Administered 2015-05-28: 50 mg via ORAL
  Filled 2015-05-27: qty 1

## 2015-05-27 MED ORDER — AMLODIPINE BESYLATE 5 MG PO TABS
5.0000 mg | ORAL_TABLET | Freq: Every day | ORAL | Status: DC
  Administered 2015-05-28: 5 mg via ORAL
  Filled 2015-05-27: qty 1

## 2015-05-27 MED ORDER — ROSUVASTATIN CALCIUM 10 MG PO TABS: 5.0000 mg | ORAL_TABLET | Freq: Every day | ORAL | Status: DC

## 2015-05-27 NOTE — Progress Notes (Signed)
Patient evaluated at [facility] for generalized weakness. Workup has been fairly unremarkable with the exception of intermittent arrhythmia captured on rhythm strip and not on EKG. EP, Dr. Patria Maneampos discussed this with Dr. Sharyn LullHarwani who [facility] Dr. Sharyn LullHarwani is to be consult when patient arrives. Other consideration is a possible UTI though UA is fairly unimpressive. Patient started on ceftriaxone at [facility] prior to transfer. This will need to be evaluated further. Urine culture sent.  Shelly Flattenavid Maila Dukes, MD [facility] 05/27/2015, 4:32 PM

## 2015-05-27 NOTE — ED Notes (Signed)
Pt having generalized weakness due to poor po intake.  Pt is post op knee surgery on Friday.  Pt discharged home on Sunday, started to feel bad on Monday.  Pt feels she may be constipated.  Pt continues to take medication for pain.

## 2015-05-27 NOTE — H&P (Signed)
[facility] History and Physical  Laura [name] NFA:[id number] DOB: Dec 18, 1933 DOA: 05/27/2015  Referring physician: EDP PCP: Shellia Cleverly   Chief Complaint: Generalized weakness   HPI: Laura [name] is a 82 y.o. female with h/o CAD, MIx3, last heart cath was nl back in Jan this year.  Patient underwent L TKA 11 days ago.  She had been doing well for the first 7 days post op, but after discharge from facility back home she developed L knee pain.  She began taking the dilaudid prescribed for post-op pain to try and treat this at home; however, she then developed anorexia, fatigue, and has had poor PO intake since then.  In the ED at [facility] she was noted to have a brief burst of PAT on monitor that was asymptomatic.  Patient transferred to [facility].  Review of Systems: no new erythema or worsening of swelling at the knee with in past few days, does have bruising which has been slowly improving since surgery.  Systems reviewed.  As above, otherwise negative  Past Medical History  Diagnosis Date  . CAD (coronary artery disease)     s/p PCI  . HTN (hypertension)   . HLD (hyperlipidemia)   . Osteoarthritis   . Hypothyroidism   . MI (myocardial infarction) (HCC) [id number]    x3   Past Surgical History  Procedure Laterality Date  . Cholecystectomy  2005  . Colon surgery  2000  . Knee surgery  2002    left  . Thoracic spine surgery  1985  . Total knee arthroplasty  2010  . Thyroidectomy, partial    . Hand surgery      bilateral  . Shoulder surgery      bilateral  . Appendectomy    . Eye surgery  2016    bilateral cataract with lens implants  . Coronary angioplasty  2005,2006,2008    x3  . Joint replacement    . Total knee arthroplasty Left 05/16/2015    Procedure: LEFT TOTAL KNEE ARTHROPLASTY;  Surgeon: Ollen Gross, MD;  Location: [facility];  Service: Orthopedics;  Laterality: Left;   Social History:  reports that she quit smoking about 32 years ago. Her smoking use  included Cigarettes. She does not have any smokeless tobacco history on file. She reports that she drinks alcohol. She reports that she does not use illicit drugs.  Allergies  Allergen Reactions  . Vicodin [Hydrocodone-Acetaminophen] Nausea And Vomiting  . Penicillins Swelling and Rash    Has patient had a PCN reaction causing immediate rash, facial/tongue/throat swelling, SOB or lightheadedness with hypotension: Yes Has patient had a PCN reaction causing severe rash involving mucus membranes or skin necrosis: No Has patient had a PCN reaction that required hospitalization No Has patient had a PCN reaction occurring within the last 10 years: No If all of the above answers are "NO", then may proceed with Cephalosporin use.    Family History  Problem Relation Age of Onset  . Cancer    . Heart disease    . Drug abuse       Prior to Admission medications   Medication Sig Start Date End Date Taking? Authorizing Provider  amLODipine (NORVASC) 5 MG tablet Take 5 mg by mouth daily.      Historical Provider, MD  aspirin 81 MG tablet Take 81 mg by mouth daily.      Historical Provider, MD  clopidogrel (PLAVIX) 75 MG tablet Take 75 mg by mouth daily.  Historical Provider, MD  DiphenhydrAMINE HCl, Sleep, (ZZZQUIL PO) Take 30 mLs by mouth at bedtime as needed (Sleep).    Historical Provider, MD  HYDROmorphone (DILAUDID) 2 MG tablet Take 1-2 tablets (2-4 mg total) by mouth every 4 (four) hours as needed for severe pain. 05/16/15   Ollen Gross, MD  iron polysaccharides (NIFEREX) 150 MG capsule Take 1 capsule (150 mg total) by mouth daily. 05/18/15   Brad [name], PA-C  levothyroxine (SYNTHROID, LEVOTHROID) 125 MCG tablet Take 125 mcg by mouth daily.      Historical Provider, MD  methocarbamol (ROBAXIN) 500 MG tablet Take 1 tablet (500 mg total) by mouth every 6 (six) hours as needed for muscle spasms. 05/16/15   Ollen Gross, MD  metoprolol (TOPROL-XL) 50 MG 24 hr tablet Take 50 mg by mouth daily.       Historical Provider, MD  nitroGLYCERIN (NITROSTAT) 0.4 MG SL tablet Place 0.4 mg under the tongue every 5 (five) minutes as needed.      Historical Provider, MD  ramipril (ALTACE) 2.5 MG capsule Take 2.5 mg by mouth daily.      Historical Provider, MD  rosuvastatin (CRESTOR) 5 MG tablet Take 5 mg by mouth daily.      Historical Provider, MD  senna (SENOKOT) 8.6 MG tablet Take 2 tablets by mouth daily.      Historical Provider, MD  traMADol (ULTRAM) 50 MG tablet Take 1-2 tablets (50-100 mg total) by mouth every 6 (six) hours as needed for moderate pain. 05/16/15   Ollen Gross, MD   Physical Exam: Filed Vitals:   05/27/15 1800 05/27/15 2115  BP: 108/43 119/48  Pulse: 64 68  Temp:  98.7 F (37.1 C)  Resp: 15 14    BP 119/48 mmHg  Pulse 68  Temp(Src) 98.7 F (37.1 C) (Oral)  Resp 14  Ht  (1.676 m)  Wt 80.513 kg (177 lb 8 oz)  BMI 28.66 kg/m2  SpO2 98%  General Appearance:    Alert, oriented, no distress, appears stated age  Head:    Normocephalic, atraumatic  Eyes:    PERRL, EOMI, sclera non-icteric        Nose:   Nares without drainage or epistaxis. Mucosa, turbinates normal  Throat:   Moist mucous membranes. Oropharynx without erythema or exudate.  Neck:   Supple. No carotid bruits.  No thyromegaly.  No lymphadenopathy.   Back:     No CVA tenderness, no spinal tenderness  Lungs:     Clear to auscultation bilaterally, without wheezes, rhonchi or rales  Chest wall:    No tenderness to palpitation  Heart:    Regular rate and rhythm without murmurs, gallops, rubs  Abdomen:     Soft, non-tender, nondistended, normal bowel sounds, no organomegaly  Genitalia:    deferred  Rectal:    deferred  Extremities:   No clubbing, cyanosis or edema.  Pulses:   2+ and symmetric all extremities  Skin:   Skin color, texture, turgor normal, no rashes or lesions  Lymph nodes:   Cervical, supraclavicular, and axillary nodes normal  Neurologic:   CNII-XII intact. Normal strength, sensation  and reflexes      throughout    Labs on Admission:  Basic Metabolic Panel:  Recent Labs Lab 05/27/15 1300  NA 137  K 3.7  CL 102  CO2 26  GLUCOSE 124*  BUN 15  CREATININE 1.00  CALCIUM 8.8*   Liver Function Tests:  Recent Labs Lab 05/27/15 1300  AST 29  ALT 19  ALKPHOS 100  BILITOT 0.9  PROT 6.8  ALBUMIN 3.6   No results for input(s): LIPASE, AMYLASE in the last 168 hours. No results for input(s): AMMONIA in the last 168 hours. CBC:  Recent Labs Lab 05/27/15 1300  WBC 10.1  HGB 9.0*  HCT 28.6*  MCV 94.4  PLT 391   Cardiac Enzymes:  Recent Labs Lab 05/27/15 1300  TROPONINI <0.03    BNP (last 3 results) No results for input(s): PROBNP in the last 8760 hours. CBG: No results for input(s): GLUCAP in the last 168 hours.  Radiological Exams on Admission: Dg Chest 2 View  05/27/2015  CLINICAL DATA:  Generalized weakness EXAM: CHEST  2 VIEW COMPARISON:  04/03/2015 FINDINGS: The heart size and mediastinal contours are within normal limits. Both lungs are clear. The visualized skeletal structures are unremarkable. IMPRESSION: No active cardiopulmonary disease. Electronically Signed   By: Alcide CleverMark  Lukens M.D.   On: 05/27/2015 13:25    EKG: Independently reviewed.  Assessment/Plan Principal Problem:   Generalized weakness Active Problems:   OA (osteoarthritis) of knee   1. Generalized weakness - 1. Most likely side effect of Dilaudid that she is using to try and treat pain in her L knee 1. PT/OT eval and treat 2. Continue senakot 3. X ray of knee 4. KUB to look for further constipation, may need additional laxatives 5. zofran for nausea 6. IVF 7. Try to wean off of dilaudid as able 2. UA unimpressive, doubt UTI 3. PAT is present during the brief burst 1. See Dr. Annitta JerseyHarwani's note 2. Serial trops ordered 3. Tele monitor 4. Patent stents on heart cath in Jan 5. Mg, PO4    Code Status: Full  Family Communication: No family in room Disposition  Plan: Admit to obs   Time spent: 70 min  GARDNER, JARED M. [facility] Pager [phone]  If 7AM-7PM, please contact the day team taking care of the patient [email or web address] Password [email or web address] 05/27/2015, 10:35 PM

## 2015-05-27 NOTE — Consult Note (Signed)
Reason for Consult: Generalized weakness associated with paroxysmal atrial tachycardia Referring Physician: EDP  Laura [name] is an 82 y.o. female.  HPI: Patient is 82 year old female with past medical history significant for coronary artery disease history of MI 3 per patient in the past status post PCI to LAD and diagonal 1 and 2007 and 2008, hypertension, hyperlipidemia, hypothyroidism, and degenerative joint disease, hiatus hernia, remote tobacco abuse, status post left total knee replacement approximately 10 days ago went to [employer] because of tired feeling associated with generalized weakness and poor appetite since hospital discharge approximately 10 days ago. Patient denies any chest pain nausea vomiting or diaphoresis. Denies any shortness of breath. Denies palpitation lightheadedness or syncope patient was noted to have brief burst of atrial tachycardia on the monitor. Patient denies any cough fever or chills. Denies any urinary complaints. Patient was treated for possible UTI and transferred to [facility] for further evaluation for cardiac arrhythmias. Patient states she actively follows up at [facility] and had preop stress test in January 2017 which was abnormal and subsequently had cardiac catheterization and was noted to have patent stents and was cleared for the surgery. Patient states he successfully underwent left total knee replacement and was discharged from in 3 days. Patient also complains of constipation and poor appetite.  Past Medical History  Diagnosis Date  . CAD (coronary artery disease)     s/p PCI  . HTN (hypertension)   . HLD (hyperlipidemia)   . Osteoarthritis   . Hypothyroidism   . MI (myocardial infarction) ([place]) [phone]    x3    Past Surgical History  Procedure Laterality Date  . Cholecystectomy  2005  . Colon surgery  2000  . Knee surgery  2002    left  . Thoracic spine surgery  1985  . Total knee arthroplasty   2010  . Thyroidectomy, partial    . Hand surgery      bilateral  . Shoulder surgery      bilateral  . Appendectomy    . Eye surgery  2016    bilateral cataract with lens implants  . Coronary angioplasty  2005,2006,2008    x3  . Joint replacement    . Total knee arthroplasty Left 05/16/2015    Procedure: LEFT TOTAL KNEE ARTHROPLASTY;  Surgeon: Gaynelle Arabian, MD;  Location: [facility];  Service: Orthopedics;  Laterality: Left;    Family History  Problem Relation Age of Onset  . Cancer    . Heart disease    . Drug abuse      Social History:  reports that she quit smoking about 32 years ago. Her smoking use included Cigarettes. She does not have any smokeless tobacco history on file. She reports that she drinks alcohol. She reports that she does not use illicit drugs.  Allergies:  Allergies  Allergen Reactions  . Vicodin [Hydrocodone-Acetaminophen] Nausea And Vomiting  . Penicillins Swelling and Rash    Has patient had a PCN reaction causing immediate rash, facial/tongue/throat swelling, SOB or lightheadedness with hypotension: Yes Has patient had a PCN reaction causing severe rash involving mucus membranes or skin necrosis: No Has patient had a PCN reaction that required hospitalization No Has patient had a PCN reaction occurring within the last 10 years: No If all of the above answers are "NO", then may proceed with Cephalosporin use.    Medications: I have reviewed the patient's current medications.  Results for orders placed or performed during the hospital  encounter of 05/27/15 (from the past 48 hour(s))  CBC     Status: Abnormal   Collection Time: 05/27/15  1:00 PM  Result Value Ref Range   WBC 10.1 4.0 - 10.5 K/uL   RBC 3.03 (L) 3.87 - 5.11 MIL/uL   Hemoglobin 9.0 (L) 12.0 - 15.0 g/dL   HCT 28.6 (L) 36.0 - 46.0 %   MCV 94.4 78.0 - 100.0 fL   MCH 29.7 26.0 - 34.0 pg   MCHC 31.5 30.0 - 36.0 g/dL   RDW 15.3 11.5 - 15.5 %   Platelets 391 150 - 400 K/uL  Comprehensive  metabolic panel     Status: Abnormal   Collection Time: 05/27/15  1:00 PM  Result Value Ref Range   Sodium 137 135 - 145 mmol/L   Potassium 3.7 3.5 - 5.1 mmol/L   Chloride 102 101 - 111 mmol/L   CO2 26 22 - 32 mmol/L   Glucose, Bld 124 (H) 65 - 99 mg/dL   BUN 15 6 - 20 mg/dL   Creatinine, Ser 1.00 0.44 - 1.00 mg/dL   Calcium 8.8 (L) 8.9 - 10.3 mg/dL   Total Protein 6.8 6.5 - 8.1 g/dL   Albumin 3.6 3.5 - 5.0 g/dL   AST 29 15 - 41 U/L   ALT 19 14 - 54 U/L   Alkaline Phosphatase 100 38 - 126 U/L   Total Bilirubin 0.9 0.3 - 1.2 mg/dL   GFR calc non Af Amer 52 (L) >60 mL/min   GFR calc Af Amer >60 >60 mL/min    Comment: (NOTE) The eGFR has been calculated using the CKD EPI equation. This calculation has not been validated in all clinical situations. eGFR's persistently <60 mL/min signify possible Chronic Kidney Disease.    Anion gap 9 5 - 15  Troponin I     Status: None   Collection Time: 05/27/15  1:00 PM  Result Value Ref Range   Troponin I <0.03 <0.031 ng/mL    Comment:        NO INDICATION OF MYOCARDIAL INJURY.   Urinalysis, Routine w reflex microscopic (not at [facility])     Status: Abnormal   Collection Time: 05/27/15  2:16 PM  Result Value Ref Range   Color, Urine YELLOW YELLOW   APPearance CLEAR CLEAR   Specific Gravity, Urine 1.014 1.005 - 1.030   pH 6.0 5.0 - 8.0   Glucose, UA NEGATIVE NEGATIVE mg/dL   Hgb urine dipstick NEGATIVE NEGATIVE   Bilirubin Urine NEGATIVE NEGATIVE   Ketones, ur NEGATIVE NEGATIVE mg/dL   Protein, ur NEGATIVE NEGATIVE mg/dL   Nitrite NEGATIVE NEGATIVE   Leukocytes, UA SMALL (A) NEGATIVE  Urine microscopic-add on     Status: Abnormal   Collection Time: 05/27/15  2:16 PM  Result Value Ref Range   Squamous Epithelial / LPF 0-5 (A) NONE SEEN   WBC, UA 0-5 0 - 5 WBC/hpf   RBC / HPF 0-5 0 - 5 RBC/hpf   Bacteria, UA MANY (A) NONE SEEN   Casts HYALINE CASTS (A) NEGATIVE    Comment: GRANULAR CAST   Urine-Other MUCOUS PRESENT     Dg Chest 2  View  05/27/2015  CLINICAL DATA:  Generalized weakness EXAM: CHEST  2 VIEW COMPARISON:  04/03/2015 FINDINGS: The heart size and mediastinal contours are within normal limits. Both lungs are clear. The visualized skeletal structures are unremarkable. IMPRESSION: No active cardiopulmonary disease. Electronically Signed   By: Inez Catalina M.D.   On: 05/27/2015 13:25  Review of Systems  Constitutional: Positive for malaise/fatigue. Negative for fever and chills.  Eyes: Negative for double vision and photophobia.  Respiratory: Negative for cough, hemoptysis, sputum production and shortness of breath.   Cardiovascular: Negative for chest pain, palpitations, orthopnea, claudication and leg swelling.  Gastrointestinal: Negative for nausea, vomiting and abdominal pain.  Genitourinary: Negative for dysuria.  Neurological: Positive for weakness. Negative for dizziness and headaches.   Blood pressure 119/48, pulse 68, temperature 98.7 F (37.1 C), temperature source Oral, resp. rate 14, height 5' 6" (1.676 m), weight 80.513 kg (177 lb 8 oz), SpO2 98 %. Physical Exam  Constitutional: She is oriented to person, place, and time.  HENT:  Head: Normocephalic and atraumatic.  Eyes: Conjunctivae are normal. Left eye exhibits no discharge. No scleral icterus.  Neck: Normal range of motion. Neck supple. No JVD present. No tracheal deviation present. No thyromegaly present.  Cardiovascular: Normal rate and regular rhythm.   Murmur (Soft systolic and diastolic murmur noted no S3 gallop no pericardial rub) heard. Respiratory: Effort normal and breath sounds normal. No respiratory distress. She has no wheezes. She has no rales.  GI: Soft. Bowel sounds are normal. She exhibits no distension. There is no tenderness. There is no rebound.  Musculoskeletal: She exhibits no edema or tenderness.  Ecchymosis left leg noted surgical incision healing well  Neurological: She is alert and oriented to person, place, and  time.    Assessment/Plan: Paroxysmal atrial tachycardia asymptomatic Generalized weakness/tiredness Possible asymptomatic UTI Coronary artery disease history of MI 3 in past status post PCI to LAD and diagonal 1 in past status post recent cardiac catheterization noted to have patent stents.  Hypertension Hyperlipidemia Hypothyroidism Degenerative joint disease History of remote tobacco abuse Hiatus hernia Plan Continue home medications Continue telemetry monitoring Check troponin I in a.m. and TSH Check electrolytes Please get records from [facility] Patient will follow-up with her cardiologist in [facility] and may need event monitor/loop recorder as outpatient. Will follow patient in a.m. if no significant arrhythmias okay to discharge from cardiac point of view.  Charolette Forward 05/27/2015, 9:47 PM

## 2015-05-27 NOTE — ED Provider Notes (Signed)
CSN: [id number]     Arrival date & time 05/27/15  1142 History   First MD Initiated Contact with Patient 05/27/15 1223     Chief Complaint  Patient presents with  . Weakness      HPI Patient presents to the emergency department with complaints of generalized weakness.  She began having generalized weakness 2-3 days ago.  She denies fevers or chills.  She denies chest pain.  No palpitations.  No cough or shortness of breath.  Denies nausea vomiting or diarrhea.  She has reported constipation for the weekend and reports mild decreased oral intake over the past 24-48 hours.  She presents because of increasing generalized weakness and she feels too weak to get up and do anything.  She is approximately 2 weeks postop from a total left knee.  She reports her knee has been healing fine and she has noticed no drainage from the left knee or redness of left knee.  She denies fevers and chills.  She does have a history of coronary artery disease status post PCI.  She had a repeat heart catheterization in January 2017 by her cardiologist in [facility] [place] who reported to her that there are no new blockages or abnormalities noted on heart catheterization.   Past Medical History  Diagnosis Date  . CAD (coronary artery disease)     s/p PCI  . HTN (hypertension)   . HLD (hyperlipidemia)   . Osteoarthritis   . Hypothyroidism   . MI (myocardial infarction) (HCC) [phone],2006,2008    x3   Past Surgical History  Procedure Laterality Date  . Cholecystectomy  2005  . Colon surgery  2000  . Knee surgery  2002    left  . Thoracic spine surgery  1985  . Total knee arthroplasty  2010  . Thyroidectomy, partial    . Hand surgery      bilateral  . Shoulder surgery      bilateral  . Appendectomy    . Eye surgery  2016    bilateral cataract with lens implants  . Coronary angioplasty  2005,2006,2008    x3  . Joint replacement    . Total knee arthroplasty Left 05/16/2015    Procedure: LEFT TOTAL  KNEE ARTHROPLASTY;  Surgeon: Ollen GrossFrank Aluisio, MD;  Location: [facility];  Service: Orthopedics;  Laterality: Left;   Family History  Problem Relation Age of Onset  . Cancer    . Heart disease    . Drug abuse     Social History  Substance Use Topics  . Smoking status: Former Smoker    Types: Cigarettes    Quit date: 03/09/1983  . Smokeless tobacco: None  . Alcohol Use: Yes     Comment: wine occassionally   OB History    No data available     Review of Systems  All other systems reviewed and are negative.     Allergies  Vicodin and Penicillins  Home Medications   Prior to Admission medications   Medication Sig Start Date End Date Taking? Authorizing Provider  amLODipine (NORVASC) 5 MG tablet Take 5 mg by mouth daily.      Historical Provider, MD  aspirin 81 MG tablet Take 81 mg by mouth daily.      Historical Provider, MD  clopidogrel (PLAVIX) 75 MG tablet Take 75 mg by mouth daily.      Historical Provider, MD  DiphenhydrAMINE HCl, Sleep, (ZZZQUIL PO) Take 30 mLs by mouth at bedtime as needed (Sleep).  Historical Provider, MD  HYDROmorphone (DILAUDID) 2 MG tablet Take 1-2 tablets (2-4 mg total) by mouth every 4 (four) hours as needed for severe pain. 05/16/15   Ollen Gross, MD  iron polysaccharides (NIFEREX) 150 MG capsule Take 1 capsule (150 mg total) by mouth daily. 05/18/15   Brad [name], PA-C  levothyroxine (SYNTHROID, LEVOTHROID) 125 MCG tablet Take 125 mcg by mouth daily.      Historical Provider, MD  methocarbamol (ROBAXIN) 500 MG tablet Take 1 tablet (500 mg total) by mouth every 6 (six) hours as needed for muscle spasms. 05/16/15   Ollen Gross, MD  metoprolol (TOPROL-XL) 50 MG 24 hr tablet Take 50 mg by mouth daily.      Historical Provider, MD  nitroGLYCERIN (NITROSTAT) 0.4 MG SL tablet Place 0.4 mg under the tongue every 5 (five) minutes as needed.      Historical Provider, MD  ramipril (ALTACE) 2.5 MG capsule Take 2.5 mg by mouth daily.      Historical Provider, MD   rosuvastatin (CRESTOR) 5 MG tablet Take 5 mg by mouth daily.      Historical Provider, MD  senna (SENOKOT) 8.6 MG tablet Take 2 tablets by mouth daily.      Historical Provider, MD  traMADol (ULTRAM) 50 MG tablet Take 1-2 tablets (50-100 mg total) by mouth every 6 (six) hours as needed for moderate pain. 05/16/15   Ollen Gross, MD   BP 114/66 mmHg  Pulse 78  Temp(Src) 98.2 F (36.8 C) (Oral)  Resp 18  Ht  (1.676 m)  Wt 180 lb (81.647 kg)  BMI 29.07 kg/m2  SpO2 96% Physical Exam  Constitutional: She is oriented to person, place, and time. She appears well-developed and well-nourished. No distress.  HENT:  Head: Normocephalic and atraumatic.  Eyes: EOM are normal.  Neck: Normal range of motion.  Cardiovascular: Normal rate, regular rhythm and normal heart sounds.   Pulmonary/Chest: Effort normal and breath sounds normal.  Abdominal: Soft. She exhibits no distension. There is no tenderness.  Musculoskeletal: Normal range of motion.  Neurological: She is alert and oriented to person, place, and time.  Skin: Skin is warm and dry.  Psychiatric: She has a normal mood and affect. Judgment normal.  Nursing note and vitals reviewed.   ED Course  Procedures    Labs Review Labs Reviewed  CBC - Abnormal; Notable for the following:    RBC 3.03 (*)    Hemoglobin 9.0 (*)    HCT 28.6 (*)    All other components within normal limits  COMPREHENSIVE METABOLIC PANEL - Abnormal; Notable for the following:    Glucose, Bld 124 (*)    Calcium 8.8 (*)    GFR calc non Af Amer 52 (*)    All other components within normal limits  URINALYSIS, ROUTINE W REFLEX MICROSCOPIC (NOT AT [facility]) - Abnormal; Notable for the following:    Leukocytes, UA SMALL (*)    All other components within normal limits  URINE MICROSCOPIC-ADD ON - Abnormal; Notable for the following:    Squamous Epithelial / LPF 0-5 (*)    Bacteria, UA MANY (*)    Casts HYALINE CASTS (*)    All other components within normal  limits  URINE CULTURE  TROPONIN I    Imaging Review Dg Chest 2 View  05/27/2015  CLINICAL DATA:  Generalized weakness EXAM: CHEST  2 VIEW COMPARISON:  04/03/2015 FINDINGS: The heart size and mediastinal contours are within normal limits. Both lungs are clear. The  visualized skeletal structures are unremarkable. IMPRESSION: No active cardiopulmonary disease. Electronically Signed   By: Alcide Clever M.D.   On: 05/27/2015 13:25   I have personally reviewed and evaluated these images and lab results as part of my medical decision-making.   EKG Interpretation   Date/Time:  Tuesday May 27 2015 12:37:01 EDT Ventricular Rate:  70 PR Interval:  136 QRS Duration: 95 QT Interval:  396 QTC Calculation: 427 R Axis:   36 Text Interpretation:  Age not entered, assumed to be  82 years old for  purpose of ECG interpretation Sinus rhythm No significant change was found  Confirmed by Blessing Ozga  MD, Caryn Bee ([id number]) on 05/27/2015 12:43:51 PM      MDM   Final diagnoses:  Weakness  Ectopic atrial rhythm  Bacteria in urine    Patient appears to have an intermittent ectopic atrial rhythm with an occasional dropped beat.  This does not appear to be a true heart block.  I think is doubly could be playing a role in why she feels so weak.  I spoke with Dr. Sharyn Lull who will see the patient in consultation.  She does have some bacteria in her urine.  Urine culture sent.  She'll be covered with a single dose of antibiotics at this time as a urinary tract infection could be making her feel the same way.  She has no active chest pain.  Her labs are otherwise unremarkable.  She'll be placed in observation at [facility] on telemetry for further evaluation.  Accepted in transfer by Dr. Dan Humphreys, MD 05/27/15 [phone]

## 2015-05-28 DIAGNOSIS — I10 Essential (primary) hypertension: Secondary | ICD-10-CM | POA: Diagnosis present

## 2015-05-28 DIAGNOSIS — R531 Weakness: Secondary | ICD-10-CM

## 2015-05-28 DIAGNOSIS — E039 Hypothyroidism, unspecified: Secondary | ICD-10-CM | POA: Diagnosis present

## 2015-05-28 DIAGNOSIS — I471 Supraventricular tachycardia: Secondary | ICD-10-CM | POA: Diagnosis present

## 2015-05-28 DIAGNOSIS — E785 Hyperlipidemia, unspecified: Secondary | ICD-10-CM | POA: Diagnosis present

## 2015-05-28 DIAGNOSIS — I2510 Atherosclerotic heart disease of native coronary artery without angina pectoris: Secondary | ICD-10-CM | POA: Diagnosis present

## 2015-05-28 LAB — URINE CULTURE

## 2015-05-28 LAB — MAGNESIUM: Magnesium: 2 mg/dL (ref 1.7–2.4)

## 2015-05-28 LAB — TROPONIN I
Troponin I: <0.03 ng/mL (ref <0.031)
Troponin I: <0.03 ng/mL (ref <0.031)

## 2015-05-28 LAB — PHOSPHORUS: PHOSPHORUS: 3.5 mg/dL (ref 2.5–4.6)

## 2015-05-28 NOTE — Discharge Summary (Signed)
Physician Discharge Summary  Laura [name] ZOX:[id number] DOB: Oct 26, 1933 DOA: 05/27/2015  PCP: Shellia Cleverly  Admit date: 05/27/2015 Discharge date: 05/28/2015  Time spent: > 30 minutes  Recommendations for Outpatient Follow-up:  1. Follow up with primary cardiologist in [facility] in 1 week   Discharge Diagnoses:  Principal Problem:   Generalized weakness Active Problems:   OA (osteoarthritis) of knee   Paroxysmal atrial tachycardia (HCC)   CAD (coronary artery disease)   HTN (hypertension)   HLD (hyperlipidemia)   Hypothyroidism  Discharge Condition: stable  Diet recommendation: heart healthy  Filed Weights   05/27/15 1154 05/27/15 2115  Weight: 81.647 kg (180 lb) 80.513 kg (177 lb 8 oz)    History of present illness:  See H&P, Labs, Consult and Test reports for all details in brief, patient is a Laura [name] is a 82 y.o. female with h/o CAD, MIx3, last heart cath was nl back in Jan this year. Patient underwent L TKA 11 days ago. She had been doing well for the first 7 days post op, but after discharge from facility back home she developed L knee pain. She began taking the dilaudid prescribed for post-op pain to try and treat this at home; however, she then developed anorexia, fatigue, and has had poor PO intake since then. In the ED at [facility] she was noted to have a brief burst of PAT on monitor that was asymptomatic. Patient transferred to [facility] Course:  Patient presented to the hospital with generalized weakness most likely in the setting of recent TKA, oral dilaudid use and ongoing constipation. In the ED, she had paroxysmal atrial tachycardia for which she was sent over to [facility] Cardiology was consulted and have followed patient will hospitalized. Hematocrit this. She was continued on beta blockers and her telemetry was without significant arrhythmias. I discussed with Dr. Sharyn Lull, patient already has a cardiologist in [facility] and she will need  outpatient follow-up. She was asymptomatic, asking to go home, was discharged in stable condition and she prefers that she follows up with her cardiologist. She is active with home PT following her TKA  Procedures:  None    Consultations:  Cardiology   Discharge Exam: Filed Vitals:   05/27/15 1730 05/27/15 1800 05/27/15 2115 05/28/15 0600  BP: 106/52 108/43 119/48 112/49  Pulse: 110 64 68 74  Temp:   98.7 F (37.1 C) 98.5 F (36.9 C)  TempSrc:   Oral Oral  Resp: Height:    (1.676 m)   Weight:   80.513 kg (177 lb 8 oz)   SpO2: 89% 98% 98% 95%    General: NAD Cardiovascular: RRR Respiratory: CTA biL  Discharge Instructions Activity:  As tolerated   Get Medicines reviewed and adjusted: Please take all your medications with you for your next visit with your Primary MD  Please request your Primary MD to go over all hospital tests and procedure/radiological results at the follow up, please ask your Primary MD to get all Hospital records sent to his/her office.  If you experience worsening of your admission symptoms, develop shortness of breath, life threatening emergency, suicidal or homicidal thoughts you must seek medical attention immediately by calling 911 or calling your MD immediately if symptoms less severe.  You must read complete instructions/literature along with all the possible adverse reactions/side effects for all the Medicines you take and that have been prescribed to you. Take any new Medicines after you  have completely understood and accpet all the possible adverse reactions/side effects.   Do not drive when taking Pain medications.   Do not take more than prescribed Pain, Sleep and Anxiety Medications  Special Instructions: If you have smoked or chewed Tobacco in the last 2 yrs please stop smoking, stop any regular Alcohol and or any Recreational drug use.  Wear Seat belts while driving.  Please note  You were cared for by a  hospitalist during your hospital stay. Once you are discharged, your primary care physician will handle any further medical issues. Please note that NO REFILLS for any discharge medications will be authorized once you are discharged, as it is imperative that you return to your primary care physician (or establish a relationship with a primary care physician if you do not have one) for your aftercare needs so that they can reassess your need for medications and monitor your lab values.    Medication List    TAKE these medications        amLODipine 5 MG tablet  Commonly known as:  NORVASC  Take 5 mg by mouth daily.     aspirin 81 MG tablet  Take 81 mg by mouth daily.     clopidogrel 75 MG tablet  Commonly known as:  PLAVIX  Take 75 mg by mouth daily.     HYDROmorphone 2 MG tablet  Commonly known as:  DILAUDID  Take 1-2 tablets (2-4 mg total) by mouth every 4 (four) hours as needed for severe pain.     levothyroxine 125 MCG tablet  Commonly known as:  SYNTHROID, LEVOTHROID  Take 125 mcg by mouth daily.     metoprolol succinate 50 MG 24 hr tablet  Commonly known as:  TOPROL-XL  Take 50 mg by mouth daily.     nitroGLYCERIN 0.4 MG SL tablet  Commonly known as:  NITROSTAT  Place 0.4 mg under the tongue every 5 (five) minutes as needed.     ramipril 2.5 MG capsule  Commonly known as:  ALTACE  Take 2.5 mg by mouth daily.     rosuvastatin 5 MG tablet  Commonly known as:  CRESTOR  Take 5 mg by mouth daily.     senna 8.6 MG tablet  Commonly known as:  SENOKOT  Take 2 tablets by mouth daily.     ZZZQUIL PO  Take 30 mLs by mouth at bedtime as needed (Sleep).         The results of significant diagnostics from this hospitalization (including imaging, microbiology, ancillary and laboratory) are listed below for reference.    Significant Diagnostic Studies: Dg Chest 2 View  05/27/2015  CLINICAL DATA:  Generalized weakness EXAM: CHEST  2 VIEW COMPARISON:  04/03/2015 FINDINGS:  The heart size and mediastinal contours are within normal limits. Both lungs are clear. The visualized skeletal structures are unremarkable. IMPRESSION: No active cardiopulmonary disease. Electronically Signed   By: Alcide Clever M.D.   On: 05/27/2015 13:25   Dg Knee 1-2 Views Left  05/28/2015  CLINICAL DATA:  Recent left knee replacement. Redness and bruising on the anterior left knee with pain. EXAM: LEFT KNEE - 1-2 VIEW COMPARISON:  None. FINDINGS: Postoperative left total knee arthroplasty. Components appear well-seated. No evidence of acute fracture or dislocation. No significant effusion. Soft tissue swelling anterior to the left knee. No radiopaque soft tissue foreign bodies or gas collections. IMPRESSION: Anterior soft tissue swelling. Postoperative left knee arthroplasty. No acute bony abnormalities. Electronically Signed   By: Chrissie Noa  Andria MeuseStevens M.D.   On: 05/28/2015 02:53   Dg Abd 1 View  05/28/2015  CLINICAL DATA:  82 year old female with constipation and loss of appetite. EXAM: ABDOMEN - 1 VIEW COMPARISON:  Pelvic MRI dated 01/04/2012 FINDINGS: There is moderate stool throughout the colon. No evidence of bowel obstruction. No free air. No radiopaque calculi. Right upper quadrant cholecystectomy clips. There is degenerative changes of the spine. No acute fracture. IMPRESSION: No bowel obstruction. Electronically Signed   By: Elgie CollardArash  Radparvar M.D.   On: 05/28/2015 02:56    Microbiology: Recent Results (from the past 240 hour(s))  Urine culture     Status: None   Collection Time: 05/27/15  2:16 PM  Result Value Ref Range Status   Specimen Description URINE, CLEAN CATCH  Final   Special Requests NONE  Final   Culture   Final    MULTIPLE SPECIES PRESENT, SUGGEST RECOLLECTION Performed [facility]    Report Status 05/28/2015 FINAL  Final     Labs: Basic Metabolic Panel:  Recent Labs Lab 05/27/15 1300 05/27/15 2330  NA 137  --   K 3.7  --   CL 102  --   CO2 26  --     GLUCOSE 124*  --   BUN 15  --   CREATININE 1.00  --   CALCIUM 8.8*  --   MG  --  2.0  PHOS  --  3.5   Liver Function Tests:  Recent Labs Lab 05/27/15 1300  AST 29  ALT 19  ALKPHOS 100  BILITOT 0.9  PROT 6.8  ALBUMIN 3.6   CBC:  Recent Labs Lab 05/27/15 1300  WBC 10.1  HGB 9.0*  HCT 28.6*  MCV 94.4  PLT 391   Cardiac Enzymes:  Recent Labs Lab 05/27/15 1300 05/27/15 2330 05/28/15 0411  TROPONINI <0.03 <0.03 <0.03     Signed:  Pamella PertGHERGHE, Genevieve Arbaugh  [facility] 05/28/2015, 4:18 PM

## 2015-05-28 NOTE — Care Management Obs Status (Signed)
MEDICARE OBSERVATION STATUS NOTIFICATION   Patient Details  Name: Laura [name] MRN: [id number] Date of Birth: [birth year]   Medicare Observation Status Notification Given:  Yes (Chest Pain)    Gala LewandowskyGraves-Bigelow, Perry Brucato Kaye, RN 05/28/2015, 11:12 AM

## 2015-05-28 NOTE — Discharge Instructions (Signed)
Follow with Shellia Cleverly in 5-7 days  Please get a complete blood count and chemistry panel checked by your Primary MD at your next visit, and again as instructed by your Primary MD. Please get your medications reviewed and adjusted by your Primary MD.  Please request your Primary MD to go over all Hospital Tests and Procedure/Radiological results at the follow up, please get all Hospital records sent to your Prim MD by signing hospital release before you go home.  If you had Pneumonia of Lung problems at the Hospital: Please get a 2 view Chest X ray done in 6-8 weeks after hospital discharge or sooner if instructed by your Primary MD.  If you have Congestive Heart Failure: Please call your Cardiologist or Primary MD anytime you have any of the following symptoms:  1) 3 pound weight gain in 24 hours or 5 pounds in 1 week  2) shortness of breath, with or without a dry hacking cough  3) swelling in the hands, feet or stomach  4) if you have to sleep on extra pillows at night in order to breathe  Follow cardiac low salt diet and 1.5 lit/day fluid restriction.  If you have diabetes Accuchecks 4 times/day, Once in AM empty stomach and then before each meal. Log in all results and show them to your primary doctor at your next visit. If any glucose reading is under 80 or above 300 call your primary MD immediately.  If you have Seizure/Convulsions/Epilepsy: Please do not drive, operate heavy machinery, participate in activities at heights or participate in high speed sports until you have seen by Primary MD or a Neurologist and advised to do so again.  If you had Gastrointestinal Bleeding: Please ask your Primary MD to check a complete blood count within one week of discharge or at your next visit. Your endoscopic/colonoscopic biopsies that are pending at the time of discharge, will also need to followed by your Primary MD.  Get Medicines reviewed and adjusted. Please take all your medications  with you for your next visit with your Primary MD  Please request your Primary MD to go over all hospital tests and procedure/radiological results at the follow up, please ask your Primary MD to get all Hospital records sent to his/her office.  If you experience worsening of your admission symptoms, develop shortness of breath, life threatening emergency, suicidal or homicidal thoughts you must seek medical attention immediately by calling 911 or calling your MD immediately  if symptoms less severe.  You must read complete instructions/literature along with all the possible adverse reactions/side effects for all the Medicines you take and that have been prescribed to you. Take any new Medicines after you have completely understood and accpet all the possible adverse reactions/side effects.   Do not drive or operate heavy machinery when taking Pain medications.   Do not take more than prescribed Pain, Sleep and Anxiety Medications  Special Instructions: If you have smoked or chewed Tobacco  in the last 2 yrs please stop smoking, stop any regular Alcohol  and or any Recreational drug use.  Wear Seat belts while driving.  Please note You were cared for by a hospitalist during your hospital stay. If you have any questions about your discharge medications or the care you received while you were in the hospital after you are discharged, you can call the unit and asked to speak with the hospitalist on call if the hospitalist that took care of you is not available. Once  you are discharged, your primary care physician will handle any further medical issues. Please note that NO REFILLS for any discharge medications will be authorized once you are discharged, as it is imperative that you return to your primary care physician (or establish a relationship with a primary care physician if you do not have one) for your aftercare needs so that they can reassess your need for medications and monitor your lab  values.  You can reach the hospitalist office at phone [phone] or fax [phone]   If you do not have a primary care physician, you can call [phone] for a physician referral.  Activity: As tolerated with Full fall precautions use walker/cane & assistance as needed  Diet: regular  Disposition Home

## 2015-05-28 NOTE — Progress Notes (Signed)
OT Cancellation Note  Patient Details Name: Laura [name] MRN: [id number] DOB: [birth year]   Cancelled Treatment:    Reason Eval/Treat Not Completed: OT screened, no needs identified, will sign off  Evern BioMayberry, Jaxson Anglin Lynn 05/28/2015, 11:35 AM

## 2015-05-28 NOTE — Progress Notes (Signed)
Subjective:  Patient denies any chest pain or shortness of breath.  Denies any palpitation, lightheadedness or syncope.  States overall feels better.  Noted to have brief episodes of nonsustained atrial tachyarrhythmias.  Objective:  Vital Signs in the last 24 hours: Temp:  [97.9 F (36.6 C)-98.7 F (37.1 C)] 98.5 F (36.9 C) (03/22 0600) Pulse Rate:  [62-110] 74 (03/22 0600) Resp:  [9-18] 16 (03/22 0600) BP: (106-140)/(38-66) 112/49 mmHg (03/22 0600) SpO2:  [89 %-98 %] 95 % (03/22 0600) Weight:  [80.513 kg (177 lb 8 oz)-81.647 kg (180 lb)] 80.513 kg (177 lb 8 oz) (03/21 2115)  Intake/Output from previous day:   Intake/Output from this shift: Total I/O In: 460 [P.O.:460] Out: -   Physical Exam: Neck: no adenopathy, no carotid bruit, no JVD and supple, symmetrical, trachea midline Lungs: clear to auscultation bilaterally Heart: regular rate and rhythm, S1, S2 normal and soft systolic murmur noted.  No S3 gallop Abdomen: soft, non-tender; bowel sounds normal; no masses,  no organomegaly Extremities: extremities normal, atraumatic, no cyanosis or edema and left knee surgical incision healing well  Lab Results:  Recent Labs  05/27/15 1300  WBC 10.1  HGB 9.0*  PLT 391    Recent Labs  05/27/15 1300  NA 137  K 3.7  CL 102  CO2 26  GLUCOSE 124*  BUN 15  CREATININE 1.00    Recent Labs  05/27/15 2330 05/28/15 0411  TROPONINI <0.03 <0.03   Hepatic Function Panel  Recent Labs  05/27/15 1300  PROT 6.8  ALBUMIN 3.6  AST 29  ALT 19  ALKPHOS 100  BILITOT 0.9   No results for input(s): CHOL in the last 72 hours. No results for input(s): PROTIME in the last 72 hours.  Imaging: Imaging results have been reviewed and Dg Chest 2 View  05/27/2015  CLINICAL DATA:  Generalized weakness EXAM: CHEST  2 VIEW COMPARISON:  04/03/2015 FINDINGS: The heart size and mediastinal contours are within normal limits. Both lungs are clear. The visualized skeletal structures are  unremarkable. IMPRESSION: No active cardiopulmonary disease. Electronically Signed   By: Alcide Clever M.D.   On: 05/27/2015 13:25   Dg Knee 1-2 Views Left  05/28/2015  CLINICAL DATA:  Recent left knee replacement. Redness and bruising on the anterior left knee with pain. EXAM: LEFT KNEE - 1-2 VIEW COMPARISON:  None. FINDINGS: Postoperative left total knee arthroplasty. Components appear well-seated. No evidence of acute fracture or dislocation. No significant effusion. Soft tissue swelling anterior to the left knee. No radiopaque soft tissue foreign bodies or gas collections. IMPRESSION: Anterior soft tissue swelling. Postoperative left knee arthroplasty. No acute bony abnormalities. Electronically Signed   By: Burman Nieves M.D.   On: 05/28/2015 02:53   Dg Abd 1 View  05/28/2015  CLINICAL DATA:  82 year old female with constipation and loss of appetite. EXAM: ABDOMEN - 1 VIEW COMPARISON:  Pelvic MRI dated 01/04/2012 FINDINGS: There is moderate stool throughout the colon. No evidence of bowel obstruction. No free air. No radiopaque calculi. Right upper quadrant cholecystectomy clips. There is degenerative changes of the spine. No acute fracture. IMPRESSION: No bowel obstruction. Electronically Signed   By: Elgie Collard M.D.   On: 05/28/2015 02:56    Cardiac Studies:  Assessment/Plan:  Paroxysmal atrial tachycardia asymptomatic Generalized weakness/tiredness Possible asymptomatic UTI Coronary artery disease history of MI 3 in past status post PCI to LAD and diagonal 1 in past status post recent cardiac catheterization noted to have patent stents.  Hypertension Hyperlipidemia  Hypothyroidism Degenerative joint disease History of remote tobacco abuse Hiatus hernia Plan Continue beta blockers and rest of medication. Okay to discharge from cardiac point of view. [place].  May need Holter/event monitor as outpatient     Rinaldo CloudHarwani,  Jolly Carlini 05/28/2015, 8:46 AM

## 2015-05-28 NOTE — Care Management Note (Signed)
Case Management Note  Patient Details  Name: Laura [name] MRN: [id number] Date of Birth: 08-25-1933  Subjective/Objective:  Pt admitted for chest pain. S/p Lt TKA on 05/16/15 who was d/c home on 3/18. [place] [facility] [place] [facility]                 Action/Plan: CM did speak with pt and daughter. HH Services stopped last week and pt will transition to outpatient services in [place] this week. Family is agreeable to outpatient services at this time. No needs from CM at this time.    Expected Discharge Date:                  Expected Discharge Plan:  Home/Self Care  In-House Referral:  NA  Discharge planning Services  CM Consult  Post Acute Care Choice:  NA Choice offered to:  NA  DME Arranged:  N/A DME Agency:  NA  HH Arranged:  NA HH Agency:  NA  Status of Service:  Completed, signed off  Medicare Important Message Given:    Date Medicare IM Given:    Medicare IM give by:    Date Additional Medicare IM Given:    Additional Medicare Important Message give by:     If discussed at Long Length of Stay Meetings, dates discussed:    Additional Comments:  Gala LewandowskyGraves-Bigelow, Sonni Barse Kaye, RN 05/28/2015, 11:12 AM

## 2015-05-28 NOTE — Evaluation (Signed)
Physical Therapy Evaluation Patient Details Name: Laura [name] MRN: [id number] DOB: 28-Jan-1934 Today's Date: 05/28/2015   History of Present Illness  Pt is a 82 y/o F s/p Lt TKA on 05/16/15 who was d/c home on 3/18.  She became concerned about increased Lt knee pain and began taking dilaudid.  She began to feel more fatigued and had a loss of appetite.  In ED at [facility], pt had a brief burst of PAT on monitor that was asymptomatic.  Pt's PMH includes CAD, MIx3, thoracic spine surgery, Bil hand surgery, Bil shoulder surgery.    Clinical Impression  Pt admitted with above diagnosis. Pt currently with functional limitations due to the deficits listed below (see PT Problem List). Laura [name] presents w/ limited ROM and strength as expected w/ Lt knee s/p Lt TKA.  She requires supervision for safety w/ transfers and ambulation. Pt will benefit from skilled PT to increase their independence and safety with mobility to allow discharge to the venue listed below.      Follow Up Recommendations Home health PT;Supervision - Intermittent    Equipment Recommendations  None recommended by PT    Recommendations for Other Services OT consult     Precautions / Restrictions Precautions Precautions: Knee;Fall Precaution Booklet Issued: No Restrictions Weight Bearing Restrictions: Yes LLE Weight Bearing: Weight bearing as tolerated      Mobility  Bed Mobility Overal bed mobility: Modified Independent Bed Mobility: Sit to Supine       Sit to supine: Modified independent (Device/Increase time)   General bed mobility comments: Increased time but no cues or physical assist needed.  Transfers Overall transfer level: Needs assistance Equipment used: None Transfers: Sit to/from Stand Sit to Stand: Supervision         General transfer comment: Supervision for safety.  Ambulation/Gait Ambulation/Gait assistance: Supervision Ambulation Distance (Feet): 200 Feet Assistive device:  None Gait Pattern/deviations: Step-through pattern;Decreased weight shift to left;Decreased stride length   Gait velocity interpretation: Below normal speed for age/gender General Gait Details: No use of AD and supervision provided for safety.    Stairs            Wheelchair Mobility    Modified Rankin (Stroke Patients Only)       Balance Overall balance assessment: Needs assistance Sitting-balance support: Feet supported Sitting balance-Leahy Scale: Good     Standing balance support: No upper extremity supported;During functional activity Standing balance-Leahy Scale: Fair                               Pertinent Vitals/Pain Pain Assessment: 0-10 Pain Score: 5  Pain Location: medial Lt knee Pain Descriptors / Indicators: Aching;Discomfort Pain Intervention(s): Limited activity within patient's tolerance;Monitored during session;Repositioned    Home Living Family/patient expects to be discharged to:: Private residence Living Arrangements: Children Available Help at Discharge: Family;Available PRN/intermittently (daughter works during the day) Type of Home: House Home Access: Stairs to enter Entrance Stairs-Rails: Left;Right;Can reach both Secretary/administrator of Steps: 3 Home Layout: Two level;Able to live on main level with bedroom/bathroom Home Equipment: Dan Humphreys - 2 wheels;Cane - single point;Crutches      Prior Function Level of Independence: Independent with assistive device(s)         Comments: Uses RW, cane, or no AD depending on how far she will be ambulating.  Has been using tub bench in tub/shower unit and dressing at mod I level.  Has been receiving HHPT since  d/c     Hand Dominance        Extremity/Trunk Assessment   Upper Extremity Assessment: Defer to OT evaluation           Lower Extremity Assessment: LLE deficits/detail   LLE Deficits / Details: limited ROM and strength as expected s/p Lt TKA on 05/16/15; brusing  inferior to Lt knee which pt reports is residual from surgery     Communication   Communication: No difficulties  Cognition Arousal/Alertness: Awake/alert Behavior During Therapy: WFL for tasks assessed/performed Overall Cognitive Status: Within Functional Limits for tasks assessed                      General Comments      Exercises Total Joint Exercises Quad Sets: Strengthening;Both;5 reps;Supine Straight Leg Raises: Strengthening;Left;10 reps;Supine Long Arc Quad: Strengthening;Left;10 reps;Seated;Other (comment) (w/ manual resistance for knee flexion and extension) Knee Flexion: AROM;Left;10 reps;Seated;Other (comment) (w/ 5 second hold) Goniometric ROM: ~80 deg Lt knee flexion w/ AROM General Exercises - Lower Extremity Mini-Sqauts: Strengthening;10 reps;Standing      Assessment/Plan    PT Assessment Patient needs continued PT services  PT Diagnosis Abnormality of gait;Acute pain   PT Problem List Decreased strength;Decreased range of motion;Decreased activity tolerance;Decreased balance;Decreased safety awareness;Pain  PT Treatment Interventions DME instruction;Gait training;Stair training;Functional mobility training;Therapeutic activities;Therapeutic exercise;Balance training;Patient/family education;Modalities   PT Goals (Current goals can be found in the Care Plan section) Acute Rehab PT Goals Patient Stated Goal: to figure out what is going on PT Goal Formulation: With patient/family Time For Goal Achievement: 06/11/15 Potential to Achieve Goals: Good    Frequency Min 5X/week   Barriers to discharge Inaccessible home environment;Decreased caregiver support Alone during the day, has steps to enter home    Co-evaluation               End of Session Equipment Utilized During Treatment: Gait belt Activity Tolerance: Patient tolerated treatment well Patient left: in bed;with call bell/phone within reach;with bed alarm set;with family/visitor  present Nurse Communication: Mobility status    Functional Assessment Tool Used: Clinical Judgement Functional Limitation: Mobility: Walking and moving around Mobility: Walking and Moving Around Current Status ([id number]): At least 1 percent but less than 20 percent impaired, limited or restricted Mobility: Walking and Moving Around Goal Status [phone]([id number]): At least 1 percent but less than 20 percent impaired, limited or restricted    Time: 0923-0950 PT Time Calculation (min) (ACUTE ONLY): 27 min   Charges:   PT Evaluation $PT Eval Low Complexity: 1 Procedure PT Treatments $Therapeutic Exercise: 8-22 mins   PT G Codes:   PT G-Codes **NOT FOR INPATIENT CLASS** Functional Assessment Tool Used: Clinical Judgement Functional Limitation: Mobility: Walking and moving around Mobility: Walking and Moving Around Current Status ([id number]([id number]): At least 1 percent but less than 20 percent impaired, limited or restricted Mobility: Walking and Moving Around Goal Status [phone]([id number]): At least 1 percent but less than 20 percent impaired, limited or restricted    Encarnacion ChuAshley Dyana Magner PT, DPT  Pager: [phone] Phone: [phone] 05/28/2015, 10:12 AM

## 2016-08-03 IMAGING — DX DG KNEE 1-2V*L*
2 series · 2 of 2 positions shown · non-contrast
Comparison: None.

CLINICAL DATA: Recent left knee replacement. Redness and bruising
on the anterior left knee with pain.

EXAM:
LEFT KNEE - 1-2 VIEW

[knee ap]
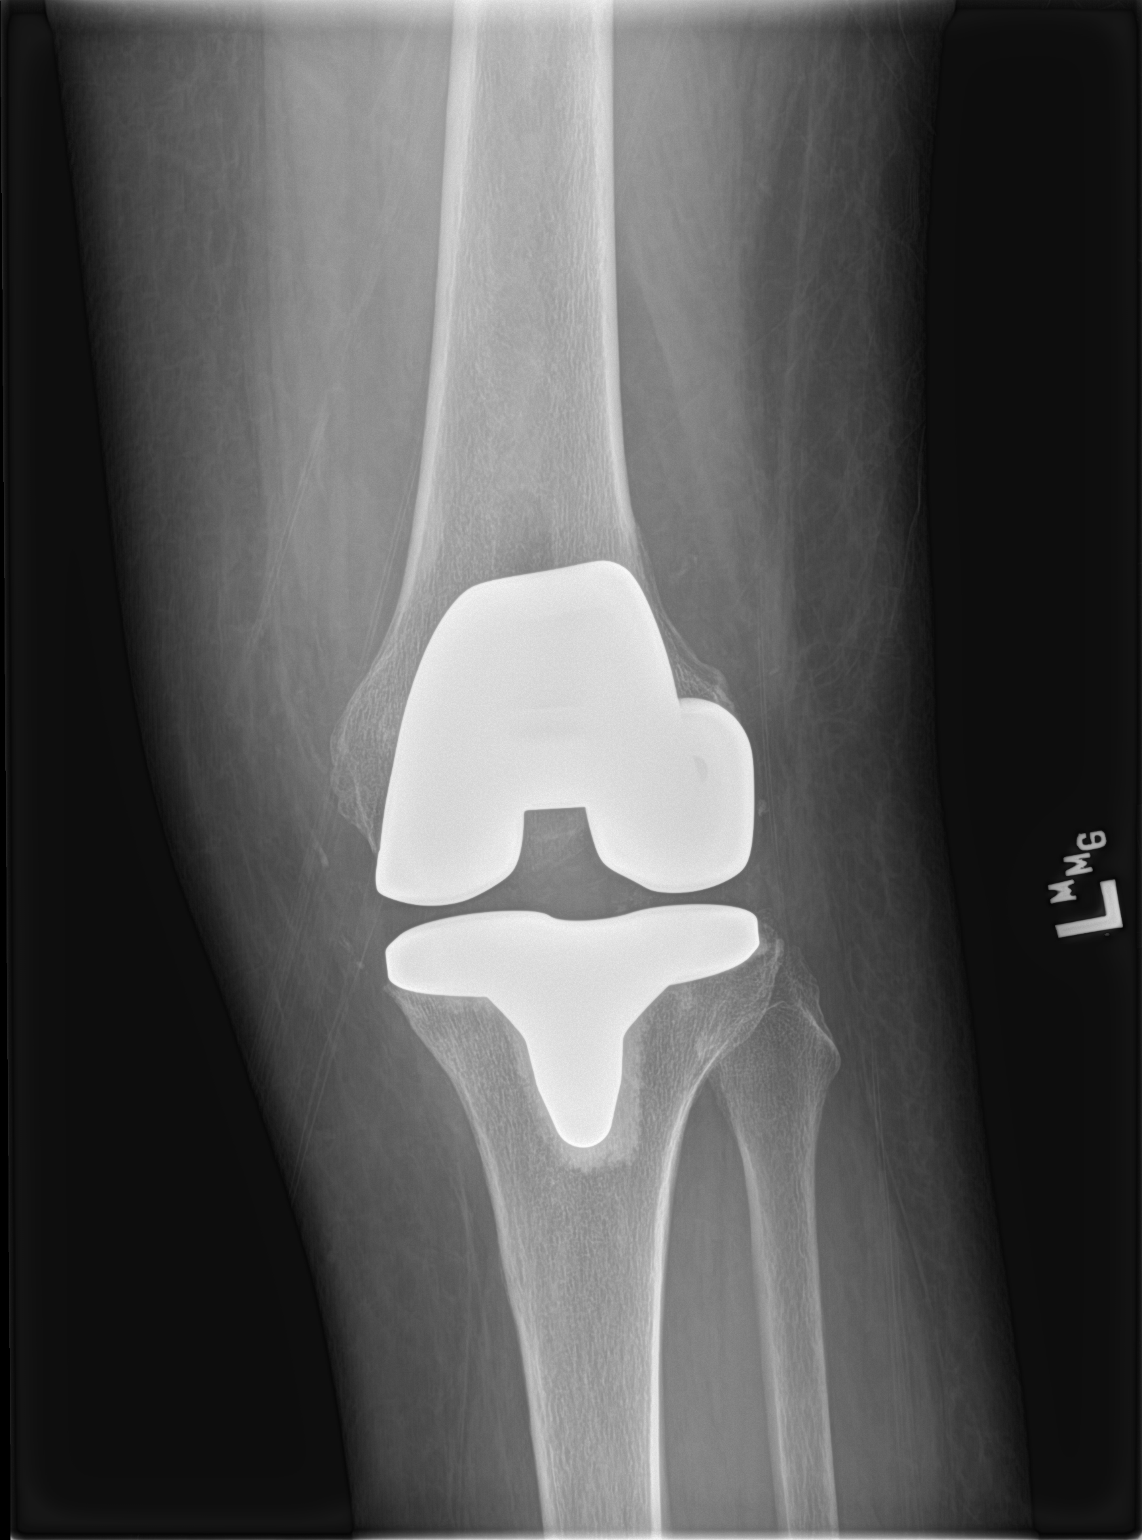

[knee lat]
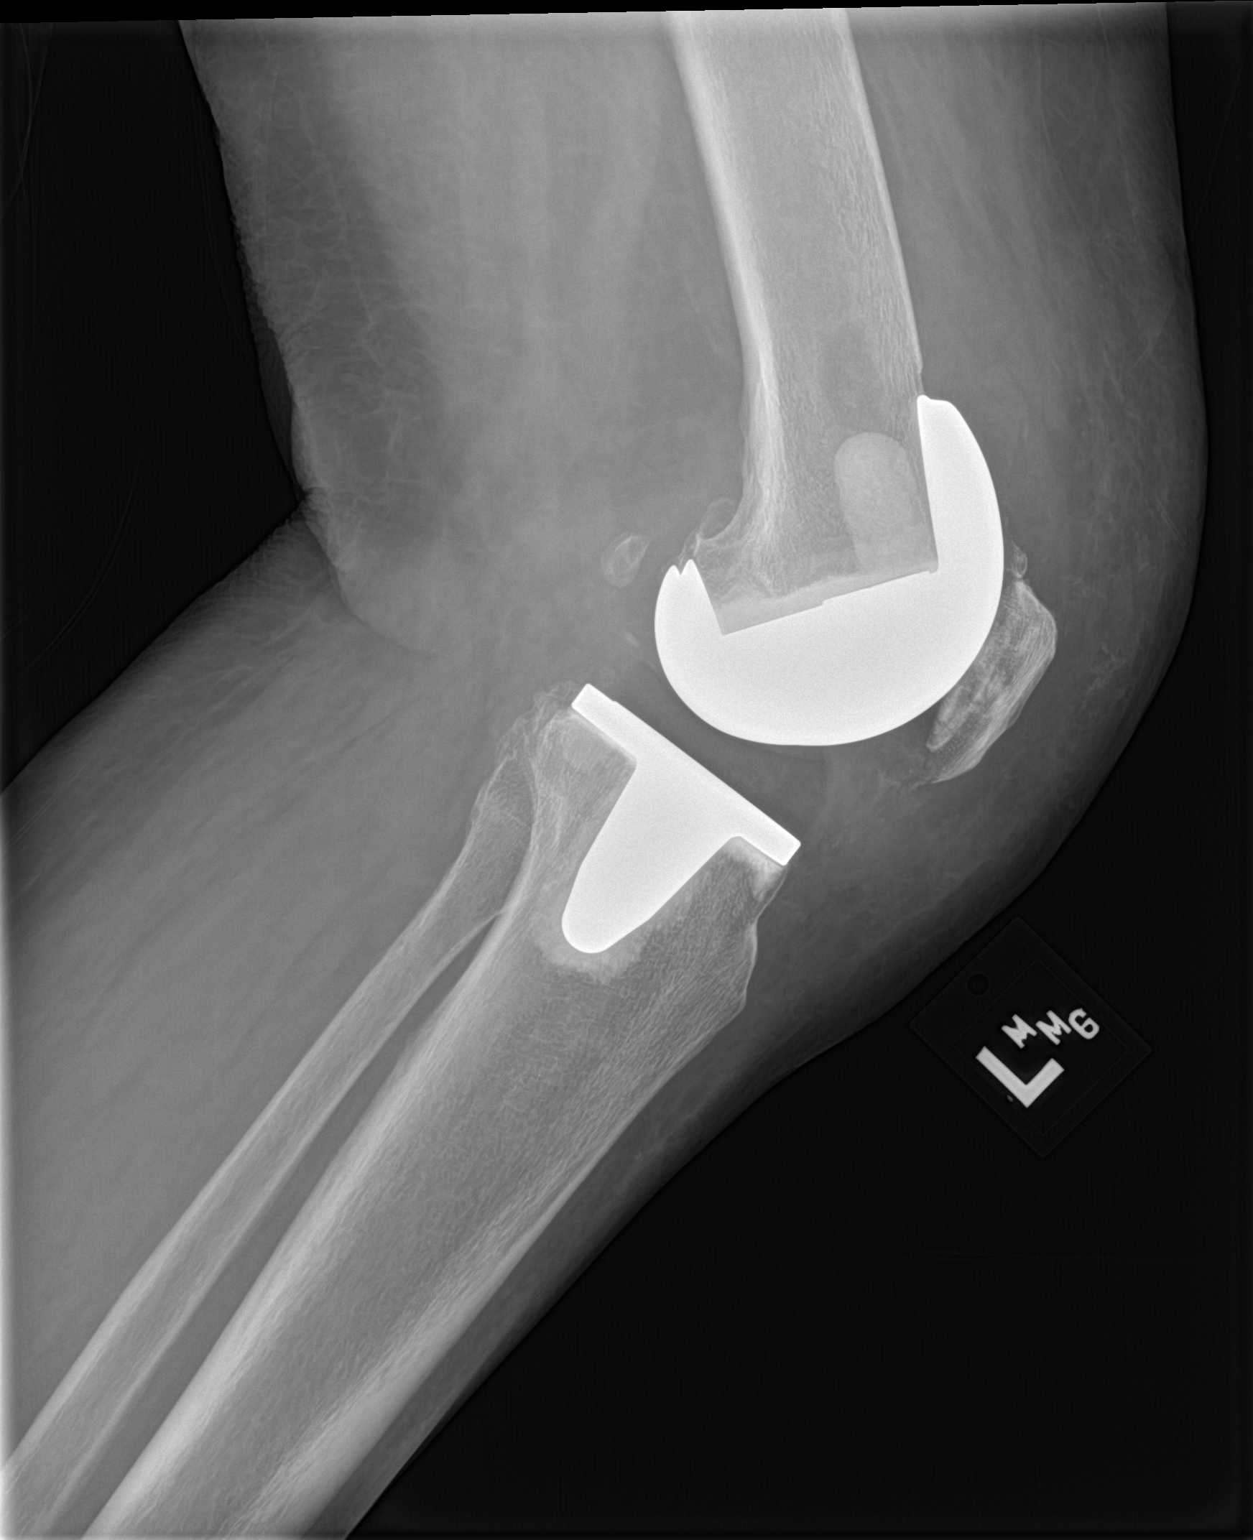

[2 of 2 positions shown; findings below may reference images not displayed]

FINDINGS: Postoperative left total knee arthroplasty. Components appear
well-seated. No evidence of acute fracture or dislocation. No
significant effusion. Soft tissue swelling anterior to the left
knee. No radiopaque soft tissue foreign bodies or gas collections.
IMPRESSION: Anterior soft tissue swelling. Postoperative left knee arthroplasty.
No acute bony abnormalities.

## 2016-08-03 IMAGING — CR DG CHEST 2V
2 series · 2 of 2 positions shown · non-contrast
Comparison: 04/03/2015

CLINICAL DATA: Generalized weakness

EXAM:
CHEST  2 VIEW

[w chest pa]
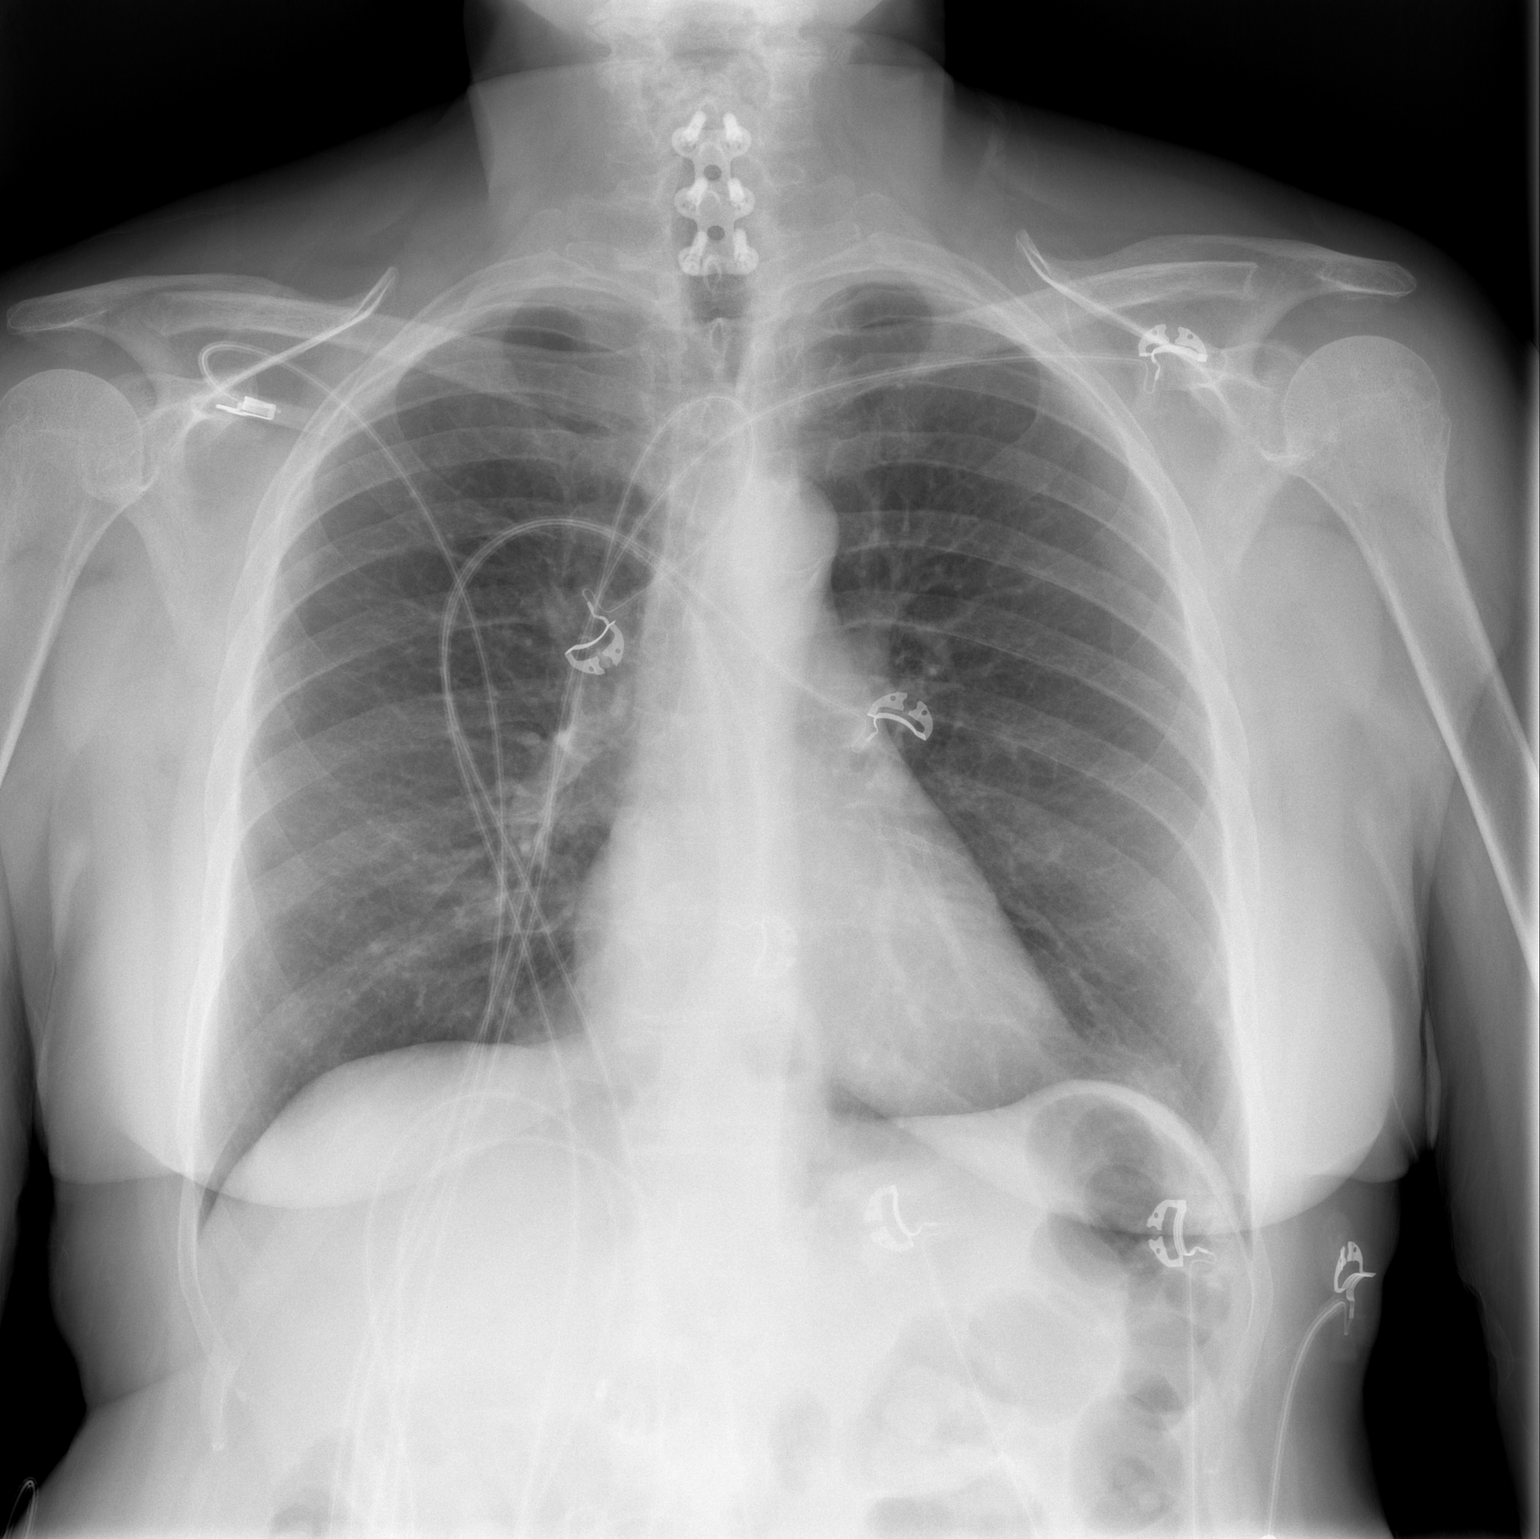

[w chest lat]
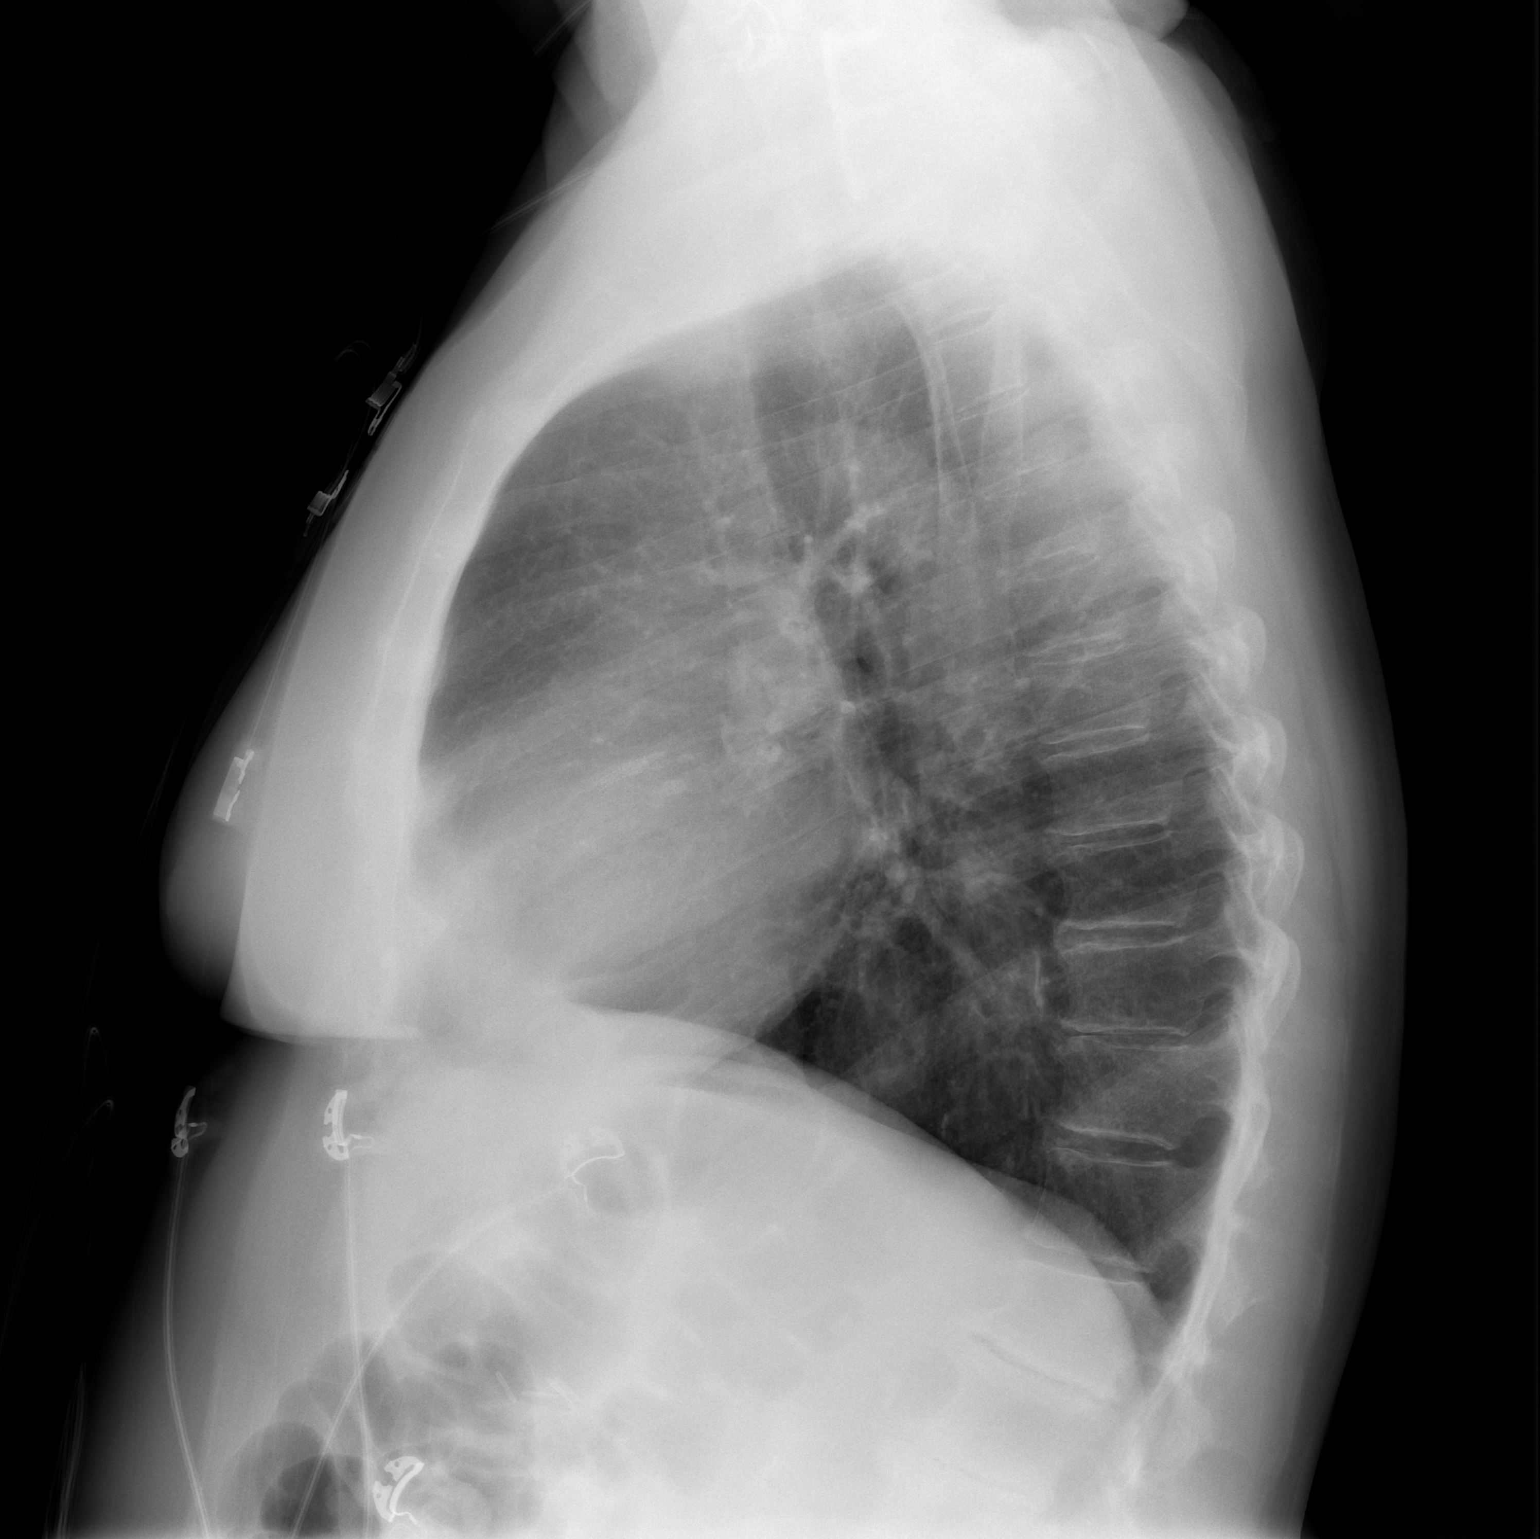

[2 of 2 positions shown; findings below may reference images not displayed]

FINDINGS: The heart size and mediastinal contours are within normal limits.
Both lungs are clear. The visualized skeletal structures are
unremarkable.
IMPRESSION: No active cardiopulmonary disease.

## 2017-08-10 ENCOUNTER — Ambulatory Visit: Payer: Medicare Other | Admitting: Podiatry

## 2017-08-10 ENCOUNTER — Encounter: Payer: Self-pay | Admitting: Podiatry

## 2017-08-10 VITALS — BP 126/64 | HR 65 | Ht 66.0 in | Wt 177.0 lb

## 2017-08-10 DIAGNOSIS — M21969 Unspecified acquired deformity of unspecified lower leg: Secondary | ICD-10-CM

## 2017-08-10 DIAGNOSIS — M722 Plantar fascial fibromatosis: Secondary | ICD-10-CM

## 2017-08-10 DIAGNOSIS — M6701 Short Achilles tendon (acquired), right ankle: Secondary | ICD-10-CM

## 2017-08-10 NOTE — Progress Notes (Signed)
SUBJECTIVE: 84 y.o. year old female presents complaining of bilateral heel pain R>L x 1 month.  Review of Systems  Constitutional: Negative.   HENT: Negative.   Eyes: Negative.   Respiratory: Negative.   Cardiovascular:       History of 3 heart attack. The last episode was 2008.  Gastrointestinal:       Problem with small intestine and low hemoglobin diagnosed in 2018.  Genitourinary: Negative.   Musculoskeletal:       Bilateral knee replacement in 2010 and 2017.     OBJECTIVE: DERMATOLOGIC EXAMINATION: Normal findings.  VASCULAR EXAMINATION OF LOWER LIMBS: All pedal pulses are palpable with normal pulsation.  No edema or erythema noted. Temperature gradient from tibial crest to dorsum of foot is within normal bilateral.  NEUROLOGIC EXAMINATION OF THE LOWER LIMBS: All epicritic and tactile sensations grossly intact. Sharp and Dull discriminatory sensations at the plantar ball of hallux is intact bilateral.   MUSCULOSKELETAL EXAMINATION: Positive for elevated first metatarsal left, hypermobile first ray right, tight Achilles tendon on right. Pain under plantar heel bilateral.   ASSESSMENT: Plantar fasciitis bilateral. Metatarsal deformity bilateral. Achilles tendon contracture.  PLAN: Reviewed findings and available treatment options, injection, stretch exercise, brace, proper shoe gear, orthotics. OTC orthotic hard shell dispensed. Patient is not able to bend down and do her own stretch exercise. Patient was unable to handle Equinus brace. Physical therapy referral placed to stretch right Achilles tendon.

## 2017-08-10 NOTE — Patient Instructions (Addendum)
Seen for bilateral heel pain. Noted of tight Achilles tendon on right, elevated first metatarsal bone on both feet. Need daily stretch exercise. Due to physical difficulties from stiff knee joint, will make referral to physical therapist. OTC orthotic hard shell dispensed. Return in 6 weeks.

## 2017-09-21 ENCOUNTER — Ambulatory Visit: Payer: Medicare Other | Admitting: Podiatry

## 2021-11-22 ENCOUNTER — Emergency Department (HOSPITAL_BASED_OUTPATIENT_CLINIC_OR_DEPARTMENT_OTHER): Payer: Medicare Other

## 2021-11-22 ENCOUNTER — Encounter (HOSPITAL_BASED_OUTPATIENT_CLINIC_OR_DEPARTMENT_OTHER): Payer: Self-pay | Admitting: Emergency Medicine

## 2021-11-22 ENCOUNTER — Other Ambulatory Visit: Payer: Self-pay

## 2021-11-22 ENCOUNTER — Emergency Department (HOSPITAL_BASED_OUTPATIENT_CLINIC_OR_DEPARTMENT_OTHER)
Admission: EM | Admit: 2021-11-22 | Discharge: 2021-11-22 | Disposition: A | Discharge status: Home / Self Care | Payer: Medicare Other | Attending: Emergency Medicine | Admitting: Emergency Medicine

## 2021-11-22 DIAGNOSIS — S61451A Open bite of right hand, initial encounter: Secondary | ICD-10-CM | POA: Insufficient documentation

## 2021-11-22 DIAGNOSIS — I2510 Atherosclerotic heart disease of native coronary artery without angina pectoris: Secondary | ICD-10-CM | POA: Diagnosis not present

## 2021-11-22 DIAGNOSIS — I10 Essential (primary) hypertension: Secondary | ICD-10-CM | POA: Insufficient documentation

## 2021-11-22 DIAGNOSIS — Z23 Encounter for immunization: Secondary | ICD-10-CM | POA: Diagnosis not present

## 2021-11-22 DIAGNOSIS — W5501XA Bitten by cat, initial encounter: Secondary | ICD-10-CM | POA: Insufficient documentation

## 2021-11-22 DIAGNOSIS — S61411A Laceration without foreign body of right hand, initial encounter: Secondary | ICD-10-CM | POA: Insufficient documentation

## 2021-11-22 DIAGNOSIS — S61402A Unspecified open wound of left hand, initial encounter: Secondary | ICD-10-CM | POA: Diagnosis not present

## 2021-11-22 DIAGNOSIS — S6991XA Unspecified injury of right wrist, hand and finger(s), initial encounter: Secondary | ICD-10-CM | POA: Diagnosis present

## 2021-11-22 MED ORDER — METRONIDAZOLE 500 MG PO TABS: 500.0000 mg | ORAL_TABLET | Freq: Three times a day (TID) | ORAL | 0 refills | Status: AC

## 2021-11-22 MED ORDER — TETANUS-DIPHTH-ACELL PERTUSSIS 5-2.5-18.5 LF-MCG/0.5 IM SUSY
0.5000 mL | PREFILLED_SYRINGE | Freq: Once | INTRAMUSCULAR | Status: AC
  Administered 2021-11-22: 0.5 mL via INTRAMUSCULAR
  Filled 2021-11-22: qty 0.5

## 2021-11-22 NOTE — Discharge Instructions (Signed)
Your history, exam, evaluation today are consistent with a penetrating cat bite on the left wrist from this known but likely unvaccinated cat.  We had a long conversation with the hematology/oncology team in the [facility] system, your hematologist/oncologist, and pharmacy who all agreed to broaden your antibiotics that you are already on with Flagyl as well as update your tetanus.  They also agreed with contacting animal control to capture the cat in order for quarantine and will hold on rabies treatment at this time.  We also agreed to wash the wound and covered with bacitracin so please keep monitoring it and go to your appointment tomorrow.  We also agreed to hold on lab testing at this time.  If any symptoms change or worsen acutely, please return to the nearest emergency department and your primary team.

## 2021-11-22 NOTE — ED Provider Notes (Signed)
Laura [name] Provider Note   CSN: [id number] Arrival date & time: 11/22/21  [id number]     History  Chief Complaint  Patient presents with   Animal Bite    Laura [name] is a 88 y.o. female.  The history is provided by the patient, a relative and medical records. No language interpreter was used.  Animal Bite Contact animal:  Cat Location:  Hand Hand injury location:  [place] of L hand Time since incident:  1 hour Pain details:    Quality:  Aching   Severity:  Mild   Timing:  Unable to specify Incident location:  Outside and home Provoked: unprovoked   Notifications:  Animal control (ED attempted to notify animal control) Animal's rabies vaccination status:  Unknown Animal in possession: no (but can be likely caught tomorrow morning)   Tetanus status:  Out of date Relieved by:  Nothing Worsened by:  Nothing Associated symptoms: no fever        Home Medications Prior to Admission medications   Medication Sig Start Date End Date Taking? Authorizing Provider  amLODipine (NORVASC) 5 MG tablet Take 5 mg by mouth daily.      [provider]  aspirin 81 MG tablet Take 81 mg by mouth daily.      [provider]  clopidogrel (PLAVIX) 75 MG tablet Take 75 mg by mouth daily.      [provider]  levothyroxine (SYNTHROID, LEVOTHROID) 125 MCG tablet Take 125 mcg by mouth daily.      [provider]  metoprolol (TOPROL-XL) 50 MG 24 hr tablet Take 50 mg by mouth daily.      [provider]  nitroGLYCERIN (NITROSTAT) 0.4 MG SL tablet Place 0.4 mg under the tongue every 5 (five) minutes as needed.      [provider]  ramipril (ALTACE) 2.5 MG capsule Take 2.5 mg by mouth daily.      [provider]  rosuvastatin (CRESTOR) 5 MG tablet Take 5 mg by mouth daily.      [provider]  senna (SENOKOT) 8.6 MG tablet Take 2 tablets by mouth daily.      [provider]       Allergies    Vicodin [hydrocodone-acetaminophen] and Penicillins    Review of Systems   Review of Systems  Constitutional:  Negative for chills, fatigue and fever.  HENT:  Negative for congestion.   Respiratory:  Negative for cough, chest tightness, shortness of breath and wheezing.   Cardiovascular:  Negative for chest pain.  Gastrointestinal:  Negative for abdominal pain, constipation, diarrhea, nausea and vomiting.  Genitourinary:  Negative for dysuria, flank pain and frequency.  Musculoskeletal:  Negative for back pain and neck pain.  Skin:  Positive for wound.  Neurological:  Negative for headaches.  Psychiatric/Behavioral:  Negative for agitation.   All other systems reviewed and are negative.   Physical Exam Updated Vital Signs BP (!) 140/84 (BP Location: Right Arm)   Pulse 80   Temp 98.3 F (36.8 C) (Oral)   Resp 18   SpO2 97%  Physical Exam Vitals and nursing note reviewed.  Constitutional:      General: She is not in acute distress.    Appearance: She is well-developed. She is not ill-appearing, toxic-appearing or diaphoretic.  HENT:     Head: Normocephalic and atraumatic.  Eyes:     Conjunctiva/sclera: Conjunctivae normal.  Cardiovascular:     Rate and Rhythm: Normal rate and regular  rhythm.     Heart sounds: No murmur heard. Pulmonary:     Effort: Pulmonary effort is normal. No respiratory distress.     Breath sounds: Normal breath sounds. No rales.  Abdominal:     Palpations: Abdomen is soft.     Tenderness: There is no abdominal tenderness. There is no guarding or rebound.  Musculoskeletal:        General: Tenderness present. No swelling.     Left hand: Laceration present.     Cervical back: Neck supple.     Right lower leg: No edema.     Left lower leg: No edema.     Comments: Small penetrating laceration to dorsum of right hand/wrist.  Also some scratches.  Some oozing bleeding but hemostatic now.  Intact cap refill, sensation, strength, and  pulses otherwise.  No crepitance.  No other injuries on exam.  Skin:    General: Skin is warm and dry.     Capillary Refill: Capillary refill takes less than 2 seconds.     Findings: No erythema or rash.     Comments: Wound to dorsum of left hand.  Neurological:     General: No focal deficit present.     Mental Status: She is alert.  Psychiatric:        Mood and Affect: Mood normal.     ED Results / Procedures / Treatments   Labs (all labs ordered are listed, but only abnormal results are displayed) Labs Reviewed - No data to display  EKG None  Radiology DG Wrist Complete Left  Result Date: 11/22/2021 CLINICAL DATA:  Cat bite EXAM: LEFT WRIST - COMPLETE 3+ VIEW COMPARISON:  None Available. FINDINGS: No recent fracture or dislocation is seen. Few small smoothly marginated calcifications adjacent to the carpals may be residual from previous injury. Degenerative changes are noted with joint space narrowing and bony spurs. Some of the proximal row of carpals are not seen, possibly residual change from previous injury. Degenerative changes are noted in first carpometacarpal joint, multiple metacarpophalangeal joints and the interphalangeal joint of thumb. IMPRESSION: No recent fracture is seen.  There are no radiopaque foreign bodies. Other findings as described in the body of the report. Electronically Signed   By: Elmer Picker M.D.   On: 11/22/2021 09:44    Procedures Procedures    Medications Ordered in ED Medications  Tdap (BOOSTRIX) injection 0.5 mL (0.5 mLs Intramuscular Given 11/22/21 1020)    ED Course/ Medical Decision Making/ A&P                           Medical Decision Making Amount and/or Complexity of Data Reviewed Radiology: ordered.  Risk Prescription drug management.    Laura [name] is a 88 y.o. female with a past medical history significant for hypertension, hyperlipidemia, CAD with previous MI, and aplastic anemia currently managed by hematology  oncology at [place] who gets frequent blood and platelet transfusions most recently on Thursday, days ago, who presents for cat bite to left wrist.  Patient is left-handed.  She reports that there has been a wild cat she has been feeding every morning at 7 AM for the last year or so and she does not think it has ever had any shots.  She reports it was acting normally recently and then today came at his normal time and when she was putting the bowl of food down for breakfast, it bit her on  the left wrist.  It then ran off but will likely return tomorrow at 7 AM for food.  Patient is unsure of her last tetanus shot.  She reports that she had bleeding on her left wrist that initially squirted blood but then was able to slow down.  She reports minimal pain and denies any other injuries.  Denies any signs of symptomatic anemia such as worsening chest pain, shortness of breath, fatigue, or lightheadedness.  She reports she occasionally feels these things but is not feeling it now.  She denies any nausea, vomiting, or other traumas.  Of note, patient reports she is on multiple antibiotics for infection prevention due to her plastic anemia where she has very low platelets, white blood cells, and red blood cells at baseline.  She is reportedly on Bactrim, Cipro, Diflucan, cefdinir, acyclovir, already to prevent infection.  She has a allergy to penicillins.  On my exam, she has a wound to her left wrist that is less than 1 cm and is having a very mild amount of oozing bleeding.  No large hematoma seen.  She has intact sensation, strength, and pulses distally and good pulses in the wrist.  No other injury seen.  Lungs clear and chest nontender.  Abdomen nontender.  Patient otherwise well-appearing.  Had a shared decision made conversation with patient.  We agreed to give her a tetanus shot as she is out of date she reports and we will have the patient or nurses call animal control to discuss capture of  this cat as it is a known cat and will likely return at 7 AM tomorrow morning to her house.  If this Is able to be captured, I do feel it is likely reasonable to hold on total rabies treatment unless the cat cannot be captured or it starts exhibiting symptoms or is positive.  I spoke with pharmacy who reviewed the patient's antibiotic regimen and did feel that if we are going to add an antibiotic, it was reasonable to start Flagyl at 500 mg 3 times daily and she is scheduled to see her oncology team tomorrow.  We will call her [facility] oncology team to discuss a plan but if they are agreeable, will start the new antibiotic to her regimen and have her follow-up tomorrow and have animal control help capture the feline.  The wound will be gently washed as patient has such low platelets I am concerned that significant scrubbing could cause recurrent bleeding and difficulty achieving hemostasis that she now nearly has.  Anticipate reassessment after discussion with her hematology/oncology team.   10:49 AM Just poke with Dr. Baird Cancer, her hematology oncology team who knows her well.  He agreed with adding Flagyl and not doing any of the rabies treatments at this time.  He recommends having her contact animal control to try to capture the cat to be observed.  He agreed with trying to gently clean the wound and then covering with bacitracin and then having her follow-up in clinic tomorrow.  We will do this and discharge patient.  11:43 AM Just washed out the wound further and there is no further bleeding.  It was covered in bacitracin as recommended by the heme-onc team and bandaged.  It was left open otherwise.  Patient will follow-up with them tomorrow for reassessment and our team attempted get in contact with animal control to start that process.  Family will also call animal control to ensure capture of the animal tomorrow for the quarantine and  monitoring.  We will print a prescription for Flagyl as  recommended by pharmacy and oncology and patient be discharged home.  Patient understand extremely strict return precautions for follow-up and monitoring and agrees with plan.  Patient discharged in good condition.         Final Clinical Impression(s) / ED Diagnoses Final diagnoses:  Cat bite, initial encounter    Rx / DC Orders ED Discharge Orders          Ordered    metroNIDAZOLE (FLAGYL) 500 MG tablet  3 times daily        11/22/21 1146            Clinical Impression: 1. Cat bite, initial encounter     Disposition: Discharge  Condition: Good  I have discussed the results, Dx and Tx plan with the pt(& family if present). He/she/they expressed understanding and agree(s) with the plan. Discharge instructions discussed at great length. Strict return precautions discussed and pt &/or family have verbalized understanding of the instructions. No further questions at time of discharge.    New Prescriptions   METRONIDAZOLE (FLAGYL) 500 MG TABLET    Take 1 tablet (500 mg total) by mouth 3 (three) times daily for 7 days.    Follow Up: [facility], [place] [address] [phone]     go to your heme onc appointment tomorrow     [place] [address] [id number] [place]        Sharifah Champine, Gwenyth Allegra, MD 11/22/21 1153

## 2021-11-22 NOTE — ED Notes (Signed)
Pt's daughter called and states that pt's eyelids are getting swollen. This RN assessed pt and no significant difference noted from when pt first arrived in ED, lower lid with loose skin/mild swelling. Pt denies new swelling, VSS, no SOB. MD notified.

## 2021-11-22 NOTE — ED Triage Notes (Signed)
Pt was bit by a stray cat this am on posterior L hand. Bleeding controlled at present.  Pt was recently treated for low platelets and is on multiple abx to prevent infection.

## 2022-01-06 DEATH — deceased
